# Patient Record
Sex: Female | Born: 1990 | Race: White | Hispanic: No | Smoking: Current every day smoker
Health system: Southern US, Community
[De-identification: ages and names within clinical notes are randomized; demographics above are authoritative.]

## PROBLEM LIST (undated history)

## (undated) DIAGNOSIS — K8020 Calculus of gallbladder without cholecystitis without obstruction: Secondary | ICD-10-CM

## (undated) DIAGNOSIS — E559 Vitamin D deficiency, unspecified: Secondary | ICD-10-CM

## (undated) HISTORY — PX: TUMOR REMOVAL: SHX12

## (undated) HISTORY — DX: Vitamin D deficiency, unspecified: E55.9

---

## 2014-10-23 ENCOUNTER — Emergency Department (HOSPITAL_COMMUNITY): Payer: BLUE CROSS/BLUE SHIELD

## 2014-10-23 ENCOUNTER — Encounter (HOSPITAL_COMMUNITY): Payer: Self-pay | Admitting: *Deleted

## 2014-10-23 ENCOUNTER — Emergency Department (HOSPITAL_COMMUNITY)
Admission: EM | Admit: 2014-10-23 | Discharge: 2014-10-23 | Disposition: A | Discharge status: Home / Self Care | Payer: BLUE CROSS/BLUE SHIELD | Attending: Emergency Medicine | Admitting: Emergency Medicine

## 2014-10-23 ENCOUNTER — Emergency Department (HOSPITAL_COMMUNITY)
Admission: EM | Admit: 2014-10-23 | Discharge: 2014-10-23 | Disposition: A | Discharge status: Other Acute Inpatient Hospital | Payer: BLUE CROSS/BLUE SHIELD | Source: Home / Self Care | Attending: Family Medicine | Admitting: Family Medicine

## 2014-10-23 DIAGNOSIS — K8020 Calculus of gallbladder without cholecystitis without obstruction: Secondary | ICD-10-CM

## 2014-10-23 DIAGNOSIS — R1013 Epigastric pain: Secondary | ICD-10-CM | POA: Diagnosis present

## 2014-10-23 DIAGNOSIS — R630 Anorexia: Secondary | ICD-10-CM | POA: Insufficient documentation

## 2014-10-23 DIAGNOSIS — R109 Unspecified abdominal pain: Secondary | ICD-10-CM

## 2014-10-23 LAB — COMPREHENSIVE METABOLIC PANEL
ALT: 17 U/L (ref 0–35)
AST: 22 U/L (ref 0–37)
Albumin: 4 g/dL (ref 3.5–5.2)
Alkaline Phosphatase: 46 U/L (ref 39–117)
Anion gap: 7 (ref 5–15)
BUN: 8 mg/dL (ref 6–23)
CO2: 26 mmol/L (ref 19–32)
CREATININE: 0.92 mg/dL (ref 0.50–1.10)
Calcium: 9.3 mg/dL (ref 8.4–10.5)
Chloride: 109 mmol/L (ref 96–112)
GFR calc Af Amer: >90 mL/min (ref >90)
GFR calc non Af Amer: 88 mL/min — ABNORMAL LOW (ref >90)
Glucose, Bld: 108 mg/dL — ABNORMAL HIGH (ref 70–99)
Potassium: 4.4 mmol/L (ref 3.5–5.1)
Sodium: 142 mmol/L (ref 135–145)
TOTAL PROTEIN: 6.8 g/dL (ref 6.0–8.3)
Total Bilirubin: 0.6 mg/dL (ref 0.3–1.2)

## 2014-10-23 LAB — CBC WITH DIFFERENTIAL/PLATELET
BASOS ABS: 0 10*3/uL (ref 0.0–0.1)
BASOS PCT: 0 % (ref 0–1)
EOS ABS: 0 10*3/uL (ref 0.0–0.7)
EOS PCT: 0 % (ref 0–5)
HEMATOCRIT: 38.3 % (ref 36.0–46.0)
HEMOGLOBIN: 13.2 g/dL (ref 12.0–15.0)
LYMPHS ABS: 1 10*3/uL (ref 0.7–4.0)
LYMPHS PCT: 8 % — AB (ref 12–46)
MCH: 30.1 pg (ref 26.0–34.0)
MCHC: 34.5 g/dL (ref 30.0–36.0)
MCV: 87.4 fL (ref 78.0–100.0)
MONO ABS: 0.5 10*3/uL (ref 0.1–1.0)
Monocytes Relative: 4 % (ref 3–12)
Neutro Abs: 11.3 10*3/uL — ABNORMAL HIGH (ref 1.7–7.7)
Neutrophils Relative %: 88 % — ABNORMAL HIGH (ref 43–77)
Platelets: 360 10*3/uL (ref 150–400)
RBC: 4.38 MIL/uL (ref 3.87–5.11)
RDW: 12.8 % (ref 11.5–15.5)
WBC: 12.8 10*3/uL — AB (ref 4.0–10.5)

## 2014-10-23 LAB — LIPASE, BLOOD: Lipase: 22 U/L (ref 11–59)

## 2014-10-23 NOTE — ED Notes (Signed)
Pt  Advised  To  Remain NPO

## 2014-10-23 NOTE — ED Notes (Signed)
Patient transported to Ultrasound 

## 2014-10-23 NOTE — ED Notes (Signed)
Pt sent here from ucc for further eval of mid abd pain that radiates into her back. Having n/v, denies diarrhea.

## 2014-10-23 NOTE — ED Notes (Signed)
Pt  Reports     Some      abd    And    Back  Pain        X   3  Months         Pt  Reports  The  Pain is  In  The  Epigastric  Area      -  She  Reports  Pain  Radiates  To  Her  Back        The  Pt is  Sitting upright on  The  Exam room     Speaking in  Complete  sentances       Skin is  Warm  And  Dry     -  Pt   Has  Not tried  Any  OTC    meds       -

## 2014-10-23 NOTE — Discharge Instructions (Signed)
Biliary Colic  °Biliary colic is a steady or irregular pain in the upper abdomen. It is usually under the right side of the rib cage. It happens when gallstones interfere with the normal flow of bile from the gallbladder. Bile is a liquid that helps to digest fats. Bile is made in the liver and stored in the gallbladder. When you eat a meal, bile passes from the gallbladder through the cystic duct and the common bile duct into the small intestine. There, it mixes with partially digested food. If a gallstone blocks either of these ducts, the normal flow of bile is blocked. The muscle cells in the bile duct contract forcefully to try to move the stone. This causes the pain of biliary colic.  °SYMPTOMS  °· A person with biliary colic usually complains of pain in the upper abdomen. This pain can be: °¨ In the center of the upper abdomen just below the breastbone. °¨ In the upper-right part of the abdomen, near the gallbladder and liver. °¨ Spread back toward the right shoulder blade. °· Nausea and vomiting. °· The pain usually occurs after eating. °· Biliary colic is usually triggered by the digestive system's demand for bile. The demand for bile is high after fatty meals. Symptoms can also occur when a person who has been fasting suddenly eats a very large meal. Most episodes of biliary colic pass after 1 to 5 hours. After the most intense pain passes, your abdomen may continue to ache mildly for about 24 hours. °DIAGNOSIS  °After you describe your symptoms, your caregiver will perform a physical exam. He or she will pay attention to the upper right portion of your belly (abdomen). This is the area of your liver and gallbladder. An ultrasound will help your caregiver look for gallstones. Specialized scans of the gallbladder may also be done. Blood tests may be done, especially if you have fever or if your pain persists. °PREVENTION  °Biliary colic can be prevented by controlling the risk factors for gallstones. Some of  these risk factors, such as heredity, increasing age, and pregnancy are a normal part of life. Obesity and a high-fat diet are risk factors you can change through a healthy lifestyle. Women going through menopause who take hormone replacement therapy (estrogen) are also more likely to develop biliary colic. °TREATMENT  °· Pain medication may be prescribed. °· You may be encouraged to eat a fat-free diet. °· If the first episode of biliary colic is severe, or episodes of colic keep retuning, surgery to remove the gallbladder (cholecystectomy) is usually recommended. This procedure can be done through small incisions using an instrument called a laparoscope. The procedure often requires a brief stay in the hospital. Some people can leave the hospital the same day. It is the most widely used treatment in people troubled by painful gallstones. It is effective and safe, with no complications in more than 90% of cases. °· If surgery cannot be done, medication that dissolves gallstones may be used. This medication is expensive and can take months or years to work. Only small stones will dissolve. °· Rarely, medication to dissolve gallstones is combined with a procedure called shock-wave lithotripsy. This procedure uses carefully aimed shock waves to break up gallstones. In many people treated with this procedure, gallstones form again within a few years. °PROGNOSIS  °If gallstones block your cystic duct or common bile duct, you are at risk for repeated episodes of biliary colic. There is also a 25% chance that you will develop   a gallbladder infection(acute cholecystitis), or some other complication of gallstones within 10 to 20 years. If you have surgery, schedule it at a time that is convenient for you and at a time when you are not sick. °HOME CARE INSTRUCTIONS  °· Drink plenty of clear fluids. °· Avoid fatty, greasy or fried foods, or any foods that make your pain worse. °· Take medications as directed. °SEEK MEDICAL  CARE IF:  °· You develop a fever over 100.5° F (38.1° C). °· Your pain gets worse over time. °· You develop nausea that prevents you from eating and drinking. °· You develop vomiting. °SEEK IMMEDIATE MEDICAL CARE IF:  °· You have continuous or severe belly (abdominal) pain which is not relieved with medications. °· You develop nausea and vomiting which is not relieved with medications. °· You have symptoms of biliary colic and you suddenly develop a fever and shaking chills. This may signal cholecystitis. Call your caregiver immediately. °· You develop a yellow color to your skin or the white part of your eyes (jaundice). °Document Released: 02/15/2006 Document Revised: 12/07/2011 Document Reviewed: 04/26/2008 °[employer]® Patient Information ©2015 [employer]. This information is not intended to replace advice given to you by your health care provider. Make sure you discuss any questions you have with your health care provider. ° °

## 2014-10-23 NOTE — ED Provider Notes (Signed)
CSN: [id number]     Arrival date & time 10/23/14  0802 History   None    Chief Complaint  Patient presents with  . Abdominal Pain   (Consider location/radiation/quality/duration/timing/severity/associated sxs/prior Treatment) Patient is a 24 y.o. female presenting with abdominal pain. The history is provided by the patient.  Abdominal Pain Pain location:  Epigastric and RUQ Pain quality: cramping   Pain radiates to:  Back Pain severity:  Moderate Onset quality:  Sudden Duration:  4 hours Chronicity:  Recurrent Context: awakening from sleep   Relieved by:  None tried Worsened by:  Nothing tried Ineffective treatments:  None tried Associated symptoms: vomiting   Associated symptoms: no diarrhea, no fever, no hematemesis and no nausea     History reviewed. No pertinent past medical history. History reviewed. No pertinent past surgical history. History reviewed. No pertinent family history. History  Substance Use Topics  . Smoking status: Not on file  . Smokeless tobacco: Not on file  . Alcohol Use: No   OB History    No data available     Review of Systems  Constitutional: Negative.  Negative for fever.  Gastrointestinal: Positive for vomiting and abdominal pain. Negative for nausea, diarrhea, blood in stool, abdominal distention and hematemesis.  Genitourinary: Negative.     Allergies  Review of patient's allergies indicates no known allergies.  Home Medications   Prior to Admission medications   Not on File   BP 116/69 mmHg  Pulse 60  Temp(Src) 98 F (36.7 C) (Oral)  Resp 16  SpO2 98%  LMP 09/26/2014 (Exact Date) Physical Exam  Constitutional: She is oriented to person, place, and time. She appears well-developed and well-nourished. No distress.  Abdominal: Soft. Normal appearance and bowel sounds are normal. She exhibits no distension and no mass. There is no hepatosplenomegaly. There is tenderness in the right upper quadrant and epigastric area. There is  no rebound, no guarding and no CVA tenderness.  Neurological: She is alert and oriented to person, place, and time.  Skin: Skin is warm and dry.  Nursing note and vitals reviewed.   ED Course  Procedures (including critical care time) Labs Review Labs Reviewed - No data to display  Imaging Review No results found.   MDM   1. Abdominal pain in female    Sent for eval --likely u/s for recurrent vomiting x 16mo episodes, poss gb.    Billy Fischer, MD 10/23/14 [phone]

## 2014-10-23 NOTE — ED Provider Notes (Addendum)
CSN: [id number]     Arrival date & time 10/23/14  3710 History   First MD Initiated Contact with Patient 10/23/14 [phone]     Chief Complaint  Patient presents with  . Abdominal Pain     (Consider location/radiation/quality/duration/timing/severity/associated sxs/prior Treatment) Patient is a 24 y.o. female presenting with abdominal pain. The history is provided by the patient.  Abdominal Pain Pain location:  Epigastric and RUQ Pain quality: sharp and shooting   Pain radiation: between the shoulders. Pain severity:  Moderate Onset quality:  Sudden Duration:  4 hours Timing:  Intermittent Progression:  Partially resolved Chronicity:  Recurrent (happening over the last 3 months) Context: eating   Context: not alcohol use   Context comment:  Pt had [employer] last night and woke up this morning with pain. Relieved by:  Nothing Worsened by:  Nothing tried Ineffective treatments:  None tried Associated symptoms: anorexia, nausea and vomiting   Associated symptoms: no chest pain, no cough, no diarrhea, no dysuria, no fever, no shortness of breath and no vaginal discharge   Risk factors: has not had multiple surgeries, not pregnant and no recent hospitalization     History reviewed. No pertinent past medical history. History reviewed. No pertinent past surgical history. History reviewed. No pertinent family history. History  Substance Use Topics  . Smoking status: Not on file  . Smokeless tobacco: Not on file  . Alcohol Use: No   OB History    No data available     Review of Systems  Constitutional: Negative for fever.  Respiratory: Negative for cough and shortness of breath.   Cardiovascular: Negative for chest pain.  Gastrointestinal: Positive for nausea, vomiting, abdominal pain and anorexia. Negative for diarrhea.  Genitourinary: Negative for dysuria and vaginal discharge.  All other systems reviewed and are negative.     Allergies  Review of patient's allergies indicates  no known allergies.  Home Medications   Prior to Admission medications   Not on File   BP 120/79 mmHg  Pulse 64  Temp(Src) 97.9 F (36.6 C) (Oral)  Resp 18  SpO2 98%  LMP 09/26/2014 (Exact Date) Physical Exam  Constitutional: She is oriented to person, place, and time. She appears well-developed and well-nourished. No distress.  HENT:  Head: Normocephalic and atraumatic.  Mouth/Throat: Oropharynx is clear and moist.  Eyes: Conjunctivae and EOM are normal. Pupils are equal, round, and reactive to light.  Neck: Normal range of motion. Neck supple.  Cardiovascular: Normal rate, regular rhythm and intact distal pulses.   No murmur heard. Pulmonary/Chest: Effort normal and breath sounds normal. No respiratory distress. She has no wheezes. She has no rales.  Abdominal: Soft. Normal appearance. She exhibits no distension. There is tenderness in the right upper quadrant and epigastric area. There is no rebound, no guarding, no CVA tenderness and negative Murphy's sign.  Musculoskeletal: Normal range of motion. She exhibits no edema or tenderness.  Neurological: She is alert and oriented to person, place, and time.  Skin: Skin is warm and dry. No rash noted. No erythema.  Psychiatric: She has a normal mood and affect. Her behavior is normal.  Nursing note and vitals reviewed.   ED Course  Procedures (including critical care time) Labs Review Labs Reviewed  CBC WITH DIFFERENTIAL/PLATELET - Abnormal; Notable for the following:    WBC 12.8 (*)    Neutrophils Relative % 88 (*)    Neutro Abs 11.3 (*)    Lymphocytes Relative 8 (*)    All other components  within normal limits  COMPREHENSIVE METABOLIC PANEL - Abnormal; Notable for the following:    Glucose, Bld 108 (*)    GFR calc non Af Amer 88 (*)    All other components within normal limits  LIPASE, BLOOD    Imaging Review US Abdomen Limited  10/23/2014   CLINICAL DATA:  24 year old female with right upper quadrant pain and  nausea for 4 months. Initial encounter.  EXAM: US ABDOMEN LIMITED - RIGHT UPPER QUADRANT  COMPARISON:  None.  FINDINGS: Gallbladder:  Several gallstones measuring up to 1.1 cm. 1.1 cm gallstone remains lodged in the gallbladder neck and does not change position with change in patient position. Gallbladder wall thickening measuring up to 6.8 mm without pericholecystic fluid. Per ultrasound technologist, patient was not tender over this region during scanning.  Common bile duct:  Diameter: 4.3 mm. Distal aspect not visualized secondary to bowel gas.  Liver:  No focal lesion identified. Within normal limits in parenchymal echogenicity.  IMPRESSION: Several gallstones with 1.1 cm gallstone lodged in the gallbladder neck region. Gallbladder wall thickening. Ultrasound technologist states the patient was not tender over this region during scanning. Findings therefore may represent chronic cholecystitis although acute cholecystitis not excluded in proper clinical setting.   Electronically Signed   By: Chauncey Cruel M.D.   On: 10/23/2014 11:07     EKG Interpretation None      MDM   Final diagnoses:  Calculus of gallbladder without cholecystitis without obstruction   patient sent from urgent care with symptoms most concerning for cholelithiasis. She has had intermittent pain in the epigastrium and right upper quadrant radiating between her shoulder blades for the last 3 months. It started in the middle of the night after she ate [employer].  She's had vomiting this morning but denies fever. She states the pain is starting to improve. She has no other medical problems. sHe denies any alcohol use.  CBC, CMP, lipase, limited right upper quadrant ultrasound pending. She currently is refusing pain medication.  12:34 PM Patient with mild leukocytosis of 12,000 but otherwise labs are normal. Ultrasound shows gallbladder wall thickening as well as gallstones. However on repeat exam patient's pain is resolved and she's  feeling much better. Counseled her about follow-up with [facility] for cholecystectomy and strict return precautions  Blanchie Dessert, MD 10/23/14 [place], MD 10/23/14 1236

## 2014-10-30 ENCOUNTER — Other Ambulatory Visit (INDEPENDENT_AMBULATORY_CARE_PROVIDER_SITE_OTHER): Payer: Self-pay | Admitting: Surgery

## 2014-10-30 NOTE — H&P (Signed)
Jodi Shah 10/30/2014 8:47 AM Location: [facility] Patient #: [id number] DOB: 1990-12-07 Separated / Language: Jodi Shah / Race: White Female History of Present Illness Jodi Hector MD; 10/30/2014 9:17 AM) Patient words: gallbladder.  The patient is a 24 year old female who presents for evaluation of gall stones. Patient sent for surgical consultation by Jodi Shah emergency room physician Dr. Maryan Shah for concern of chronic cholecystitis.  Pleasant obese female. She has struggled with intermittent bouts of abdominal pain for the past 5 months. Usually wakes her up at night. Epigastric pain that radiates to her back. She will get nausea and vomiting. Not related to activity. Denies any heartburn or reflux. She does confess that she "eats a lot of junk food". Normally has a bowel movement every day. No history of irritable bowel syndrome or inflammatory bowel disease. No history of hepatitis or pancreatitis. No history of jaundice. Can walk a half hour without difficulty. No history of cardiopulmonary or neurologic issues. She's never had any abdominal surgery.          CLINICAL DATA: 24 year old female with right upper quadrant pain and nausea for 4 months. Initial encounter.  EXAM: US ABDOMEN LIMITED - RIGHT UPPER QUADRANT  COMPARISON: None.  FINDINGS: Gallbladder:  Several gallstones measuring up to 1.1 cm. 1.1 cm gallstone remains lodged in the gallbladder neck and does not change position with change in patient position. Gallbladder wall thickening measuring up to 6.8 mm without pericholecystic fluid. Per ultrasound technologist, patient was not tender over this region during scanning.  Common bile duct:  Diameter: 4.3 mm. Distal aspect not visualized secondary to bowel gas.  Liver:  No focal lesion identified. Within normal limits in parenchymal echogenicity.  IMPRESSION: Several gallstones with 1.1 cm gallstone lodged in the  gallbladder neck region. Gallbladder wall thickening. Ultrasound technologist states the patient was not tender over this region during scanning. Findings therefore may represent chronic cholecystitis although acute cholecystitis not excluded in proper clinical setting.   Electronically Signed By: Jodi Shah M.D. On: 10/23/2014 11:07 Other Problems ([place], [place]; 10/30/2014 8:47 AM) Cholelithiasis Depression  Past Surgical History Jodi Shah, Jodi Shah; 10/30/2014 8:47 AM) Oral Surgery  Diagnostic Studies History Jodi Shah, Jodi Shah; 10/30/2014 8:47 AM) Colonoscopy never Mammogram never Pap Smear never  Allergies Jodi Shah, Jodi Shah; 10/30/2014 8:48 AM) No Known Drug Allergies 10/30/2014  Medication History (Jodi Shah, Jodi Shah; 10/30/2014 8:48 AM) No Current Medications  Social History Jodi Shah, Jodi Shah; 10/30/2014 8:47 AM) Alcohol use Moderate alcohol use. Caffeine use Carbonated beverages, Coffee. Illicit drug use Uses monthly. Tobacco use Current some day smoker.  Family History Jodi Shah, Jodi Shah; 10/30/2014 8:47 AM) Alcohol Abuse Brother.  Pregnancy / Birth History Jodi Shah, [place]; 10/30/2014 8:47 AM) Age at menarche 17 years. Gravida 0 Irregular periods Para 0     Review of Systems (Jodi Shah Jodi Shah; 10/30/2014 8:47 AM) General Not Present- Appetite Loss, Chills, Fatigue, Fever, Night Sweats, Weight Gain and Weight Loss. Skin Not Present- Change in Wart/Mole, Dryness, Hives, Jaundice, New Lesions, Non-Healing Wounds, Rash and Ulcer. HEENT Not Present- Earache, Hearing Loss, Hoarseness, Nose Bleed, Oral Ulcers, Ringing in the Ears, Seasonal Allergies, Sinus Pain, Sore Throat, Visual Disturbances, Wears glasses/contact lenses and Yellow Eyes. Respiratory Not Present- Bloody sputum, Chronic Cough, Difficulty Breathing, Snoring and Wheezing. Breast Not Present- Breast Mass, Breast Pain, Nipple Discharge and Skin Changes. Cardiovascular Not Present- Chest Pain,  Difficulty Breathing Lying Down, Leg Cramps, Palpitations, Rapid Heart Rate, Shortness of Breath and Swelling of Extremities. Gastrointestinal Not  Present- Abdominal Pain, Bloating, Bloody Stool, Change in Bowel Habits, Chronic diarrhea, Constipation, Difficulty Swallowing, Excessive gas, Gets full quickly at meals, Hemorrhoids, Indigestion, Nausea, Rectal Pain and Vomiting. Female Genitourinary Not Present- Frequency, Nocturia, Painful Urination, Pelvic Pain and Urgency. Musculoskeletal Not Present- Back Pain, Joint Pain, Joint Stiffness, Muscle Pain, Muscle Weakness and Swelling of Extremities. Neurological Not Present- Decreased Memory, Fainting, Headaches, Numbness, Seizures, Tingling, Tremor, Trouble walking and Weakness. Psychiatric Present- Bipolar. Not Present- Anxiety, Change in Sleep Pattern, Depression, Fearful and Frequent crying. Endocrine Not Present- Cold Intolerance, Excessive Hunger, Hair Changes, Heat Intolerance, Hot flashes and New Diabetes. Hematology Not Present- Easy Bruising, Excessive bleeding, Gland problems, HIV and Persistent Infections.  Vitals (Jodi Shah Jodi Shah; 10/30/2014 8:48 AM) 10/30/2014 8:48 AM Weight: 193 lb Height: 64in Body Surface Area: 1.99 m Body Mass Index: 33.13 kg/m Temp.: 98.61F(Temporal)  Pulse: 77 (Regular)  BP: 124/84 (Sitting, Left Arm, Standard)     Physical Exam Jodi Hector MD; 10/30/2014 9:15 AM)  General Mental Status-Alert. General Appearance-Not in acute distress, Not Sickly. Orientation-Oriented X3. Hydration-Well hydrated. Voice-Normal.  Integumentary Global Assessment Upon inspection and palpation of skin surfaces of the - Axillae: non-tender, no inflammation or ulceration, no drainage. and Distribution of scalp and body hair is normal. General Characteristics Temperature - normal warmth is noted.  Head and Neck Head-normocephalic, atraumatic with no lesions or palpable masses. Face Global  Assessment - atraumatic, no absence of expression. Neck Global Assessment - no abnormal movements, no bruit auscultated on the right, no bruit auscultated on the left, no decreased range of motion, non-tender. Trachea-midline. Thyroid Gland Characteristics - non-tender.  Eye Eyeball - Left-Extraocular movements intact, No Nystagmus. Eyeball - Right-Extraocular movements intact, No Nystagmus. Cornea - Left-No Hazy. Cornea - Right-No Hazy. Sclera/Conjunctiva - Left-No scleral icterus, No Discharge. Sclera/Conjunctiva - Right-No scleral icterus, No Discharge. Pupil - Left-Direct reaction to light normal. Pupil - Right-Direct reaction to light normal.  ENMT Ears Pinna - Left - no drainage observed, no generalized tenderness observed. Right - no drainage observed, no generalized tenderness observed. Nose and Sinuses External Inspection of the Nose - no destructive lesion observed. Inspection of the nares - Left - quiet respiration. Right - quiet respiration. Mouth and Throat Lips - Upper Lip - no fissures observed, no pallor noted. Lower Lip - no fissures observed, no pallor noted. Nasopharynx - no discharge present. Oral Cavity/Oropharynx - Tongue - no dryness observed. Oral Mucosa - no cyanosis observed. Hypopharynx - no evidence of airway distress observed.  Chest and Lung Exam Inspection Movements - Normal and Symmetrical. Accessory muscles - No use of accessory muscles in breathing. Palpation Palpation of the chest reveals - Non-tender. Auscultation Breath sounds - Normal and Clear.  Cardiovascular Auscultation Rhythm - Regular. Murmurs & Other Heart Sounds - Auscultation of the heart reveals - No Murmurs and No Systolic Clicks.  Abdomen Inspection Inspection of the abdomen reveals - No Visible peristalsis and No Abnormal pulsations. Umbilicus - No Bleeding, No Urine drainage. Palpation/Percussion Palpation and Percussion of the abdomen reveal - Soft, Non  Tender, No Rebound tenderness, No Rigidity (guarding) and No Cutaneous hyperesthesia.  Female Genitourinary Sexual Maturity Tanner 5 - Adult hair pattern. Note: No vaginal bleeding nor discharge   Peripheral Vascular Upper Extremity Inspection - Left - No Cyanotic nailbeds, Not Ischemic. Right - No Cyanotic nailbeds, Not Ischemic.  Neurologic Neurologic evaluation reveals -normal attention span and ability to concentrate, able to name objects and repeat phrases. Appropriate fund of knowledge , normal sensation and  normal coordination. Mental Status Affect - not angry, not paranoid. Cranial Nerves-Normal Bilaterally. Gait-Normal.  Neuropsychiatric Mental status exam performed with findings of-able to articulate well with normal speech/language, rate, volume and coherence, thought content normal with ability to perform basic computations and apply abstract reasoning and no evidence of hallucinations, delusions, obsessions or homicidal/suicidal ideation.  Musculoskeletal Global Assessment Spine, Ribs and Pelvis - no instability, subluxation or laxity. Right Upper Extremity - no instability, subluxation or laxity.  Lymphatic Head & Neck  General Head & Neck Lymphatics: Bilateral - Description - No Localized lymphadenopathy. Axillary  General Axillary Region: Bilateral - Description - No Localized lymphadenopathy. Femoral & Inguinal  Generalized Femoral & Inguinal Lymphatics: Left - Description - No Localized lymphadenopathy. Right - Description - No Localized lymphadenopathy.    Assessment & Plan Jodi Hector MD; 10/30/2014 9:17 AM)  CHRONIC CHOLECYSTITIS WITH CALCULUS (574.10  K80.10)  Impression: Story very classic for recurrent bouts of biliary colic. Some GB wall thickening noted on ultrasound but no Murphy sign. Suspicious for chronic cholecystitis. I think she would benefit from cholecystectomy. Rest of differential diagnosis seems unlikely. She agrees. We'll  plan outpatient cholecystectomy soon. Reasonable start out with single site approach.  The anatomy & physiology of hepatobiliary & pancreatic function was discussed. The pathophysiology of gallbladder dysfunction was discussed. Natural history risks without surgery was discussed. I feel the risks of no intervention will lead to serious problems that outweigh the operative risks; therefore, I recommended cholecystectomy to remove the pathology. I explained laparoscopic techniques with possible need for an open approach. Probable cholangiogram to evaluate the bilary tract was explained as well.  Risks such as bleeding, infection, abscess, leak, injury to other organs, need for further treatment, heart attack, death, and other risks were discussed. I noted a good likelihood this will help address the problem. Possibility that this will not correct all abdominal symptoms was explained. Goals of post-operative recovery were discussed as well. We will work to minimize complications. An educational handout further explaining the pathology and treatment options was given as well. Questions were answered. The patient expresses understanding & wishes to proceed with surgery.  Current Plans Schedule for Surgery Pt Education - [facility] Laparosopic Post Op HCI (Marilynn Ekstein) Pt Education - [facility] Good Bowel Health (Ziomara Birenbaum) Pt Education - [facility] Pain Control (Ladarrious Kirksey) The anatomy & physiology of hepatobiliary & pancreatic function was discussed. The pathophysiology of gallbladder dysfunction was discussed. Natural history risks without surgery was discussed. I feel the risks of no intervention will lead to serious problems that outweigh the operative risks; therefore, I recommended cholecystectomy to remove the pathology. I explained laparoscopic techniques with possible need for an open approach. Probable cholangiogram to evaluate the bilary tract was explained as well.  Risks such as bleeding, infection, abscess, leak, injury to other  organs, need for further treatment, heart attack, death, and other risks were discussed. I noted a good likelihood this will help address the problem. Possibility that this will not correct all abdominal symptoms was explained. Goals of post-operative recovery were discussed as well. We will work to minimize complications. An educational handout further explaining the pathology and treatment options was given as well. Questions were answered. The patient expresses understanding & wishes to proceed with surgery.  Jodi Shah, M.D., F.A.C.S. Gastrointestinal and Minimally Invasive Surgery [facility] [address] [place], [place] [address] [phone] Main / Paging

## 2014-12-09 ENCOUNTER — Encounter (HOSPITAL_COMMUNITY): Payer: Self-pay | Admitting: Emergency Medicine

## 2014-12-09 ENCOUNTER — Observation Stay (HOSPITAL_COMMUNITY)
Admission: EM | Admit: 2014-12-09 | Discharge: 2014-12-11 | Disposition: A | Discharge status: Home / Self Care | Payer: BLUE CROSS/BLUE SHIELD | Attending: General Surgery | Admitting: General Surgery

## 2014-12-09 DIAGNOSIS — K8000 Calculus of gallbladder with acute cholecystitis without obstruction: Secondary | ICD-10-CM | POA: Diagnosis present

## 2014-12-09 DIAGNOSIS — K8020 Calculus of gallbladder without cholecystitis without obstruction: Secondary | ICD-10-CM

## 2014-12-09 DIAGNOSIS — Z6831 Body mass index (BMI) 31.0-31.9, adult: Secondary | ICD-10-CM | POA: Insufficient documentation

## 2014-12-09 DIAGNOSIS — F17210 Nicotine dependence, cigarettes, uncomplicated: Secondary | ICD-10-CM | POA: Diagnosis not present

## 2014-12-09 DIAGNOSIS — R109 Unspecified abdominal pain: Secondary | ICD-10-CM | POA: Diagnosis present

## 2014-12-09 HISTORY — DX: Calculus of gallbladder without cholecystitis without obstruction: K80.20

## 2014-12-09 LAB — CBC WITH DIFFERENTIAL/PLATELET
BASOS PCT: 0 % (ref 0–1)
Basophils Absolute: 0 10*3/uL (ref 0.0–0.1)
EOS ABS: 0 10*3/uL (ref 0.0–0.7)
Eosinophils Relative: 0 % (ref 0–5)
HEMATOCRIT: 40.1 % (ref 36.0–46.0)
Hemoglobin: 13.9 g/dL (ref 12.0–15.0)
LYMPHS ABS: 0.7 10*3/uL (ref 0.7–4.0)
Lymphocytes Relative: 5 % — ABNORMAL LOW (ref 12–46)
MCH: 30.5 pg (ref 26.0–34.0)
MCHC: 34.7 g/dL (ref 30.0–36.0)
MCV: 87.9 fL (ref 78.0–100.0)
MONO ABS: 0.3 10*3/uL (ref 0.1–1.0)
Monocytes Relative: 2 % — ABNORMAL LOW (ref 3–12)
NEUTROS ABS: 13.8 10*3/uL — AB (ref 1.7–7.7)
Neutrophils Relative %: 93 % — ABNORMAL HIGH (ref 43–77)
PLATELETS: 340 10*3/uL (ref 150–400)
RBC: 4.56 MIL/uL (ref 3.87–5.11)
RDW: 12.7 % (ref 11.5–15.5)
WBC: 14.8 10*3/uL — ABNORMAL HIGH (ref 4.0–10.5)

## 2014-12-09 LAB — COMPREHENSIVE METABOLIC PANEL
ALT: 13 U/L (ref 0–35)
AST: 22 U/L (ref 0–37)
Albumin: 4.1 g/dL (ref 3.5–5.2)
Alkaline Phosphatase: 46 U/L (ref 39–117)
Anion gap: 10 (ref 5–15)
BUN: 6 mg/dL (ref 6–23)
CHLORIDE: 104 mmol/L (ref 96–112)
CO2: 26 mmol/L (ref 19–32)
Calcium: 9.8 mg/dL (ref 8.4–10.5)
Creatinine, Ser: 0.91 mg/dL (ref 0.50–1.10)
GFR calc non Af Amer: 89 mL/min — ABNORMAL LOW (ref >90)
Glucose, Bld: 121 mg/dL — ABNORMAL HIGH (ref 70–99)
POTASSIUM: 4.6 mmol/L (ref 3.5–5.1)
SODIUM: 140 mmol/L (ref 135–145)
Total Bilirubin: 0.7 mg/dL (ref 0.3–1.2)
Total Protein: 7.1 g/dL (ref 6.0–8.3)

## 2014-12-09 LAB — LIPASE, BLOOD: Lipase: 16 U/L (ref 11–59)

## 2014-12-09 MED ORDER — SODIUM CHLORIDE 0.9 % IV BOLUS (SEPSIS): 1000.0000 mL | Freq: Once | INTRAVENOUS | Status: DC

## 2014-12-09 MED ORDER — ONDANSETRON 4 MG PO TBDP
4.0000 mg | ORAL_TABLET | Freq: Once | ORAL | Status: AC
  Administered 2014-12-09: 4 mg via ORAL
  Filled 2014-12-09: qty 1

## 2014-12-09 MED ORDER — SODIUM CHLORIDE 0.9 % IV SOLN: INTRAVENOUS | Status: DC

## 2014-12-09 MED ORDER — FENTANYL CITRATE 0.05 MG/ML IJ SOLN
50.0000 ug | Freq: Once | INTRAMUSCULAR | Status: AC
  Administered 2014-12-09: 50 ug via NASAL
  Filled 2014-12-09: qty 2

## 2014-12-09 NOTE — ED Notes (Addendum)
Pt reports that she was dx with gallstones, advised to come back in if she started vomiting and could not get it under control. Pt reports vomiting started this morning after eating donuts and has continued throughout the day. Pt not actively vomiting at this time, alert, oriented, nad.

## 2014-12-09 NOTE — ED Notes (Signed)
Patient states she drove but can call to have someone pick her up.

## 2014-12-09 NOTE — ED Provider Notes (Signed)
CSN: [id number]     Arrival date & time 12/09/14  1926 History   This chart was scribed for Jodi Leigh, MD by Eustaquio Maize, ED Scribe. This patient was seen in room D31C/D31C and the patient's care was started at 11:12 PM.    Chief Complaint  Patient presents with  . Abdominal Pain    diagnosis of gallstones recently  . Emesis   HPI Comments: Sx persistent and nothing makes them better, no tx used pta  The history is provided by the patient. No language interpreter was used.    HPI Comments: Jodi Shah is a 24 y.o. female who presents to the Emergency Department complaining of vomiting that began earlier this morning around 10 AM. Pt reports that the symptoms began after eating doughnuts. She also complains of abdominal pain in the RUQ and epigastric region. Pt was diagnosed with gall stones recently and is scheduled to have surgery at [facility] in April She denies fever or any other symptoms.    Past Medical History  Diagnosis Date  . Gallstones    History reviewed. No pertinent past surgical history. No family history on file. History  Substance Use Topics  . Smoking status: Current Every Day Smoker -- 0.50 packs/day    Types: Cigarettes  . Smokeless tobacco: Current User  . Alcohol Use: 2.4 oz/week    4 Glasses of wine per week     Comment: socially only    OB History    No data available     Review of Systems  Constitutional: Negative for fever.  Gastrointestinal: Positive for nausea, vomiting and abdominal pain (RUQ and epigastric pain. ).  All other systems reviewed and are negative.     Allergies  Pumpkin seed  Home Medications   Prior to Admission medications   Not on File   Triage Vitals: BP 95/55 mmHg  Pulse 79  Temp(Src) 98.3 F (36.8 C) (Oral)  Resp 22  Ht 5\' 4"  (1.626 m)  Wt 185 lb (83.915 kg)  BMI 31.74 kg/m2  SpO2 99%  LMP 12/02/2014 (Approximate)   Physical Exam  Constitutional: She is oriented to person, place, and time.  She appears well-developed and well-nourished.  Non-toxic appearance. No distress.     HENT:  Head: Normocephalic and atraumatic.  Eyes: Conjunctivae, EOM and lids are normal. Pupils are equal, round, and reactive to light.  Neck: Normal range of motion. Neck supple. No tracheal deviation present. No thyroid mass present.  Cardiovascular: Normal rate, regular rhythm and normal heart sounds.  Exam reveals no gallop.   No murmur heard. Pulmonary/Chest: Effort normal and breath sounds normal. No stridor. No respiratory distress. She has no decreased breath sounds. She has no wheezes. She has no rhonchi. She has no rales.  Abdominal: Soft. Normal appearance and bowel sounds are normal. She exhibits no distension. There is tenderness (Mild tenderness RUQ and epigastric area. ). There is no rebound, no guarding and no CVA tenderness.  Musculoskeletal: Normal range of motion. She exhibits no edema or tenderness.  Neurological: She is alert and oriented to person, place, and time. She has normal strength. No cranial nerve deficit or sensory deficit. GCS eye subscore is 4. GCS verbal subscore is 5. GCS motor subscore is 6.  Skin: Skin is warm and dry. No abrasion and no rash noted.  Psychiatric: She has a normal mood and affect. Her speech is normal and behavior is normal.  Nursing note and vitals reviewed.   ED Course  Procedures (  including critical care time)  DIAGNOSTIC STUDIES: Oxygen Saturation is 99% on Ra, normal by my interpretation.    COORDINATION OF CARE: 11:14 PM-Discussed treatment plan which includes CBC, CMP, Lipase with pt at bedside and pt agreed to plan.   Labs Review Labs Reviewed  CBC WITH DIFFERENTIAL/PLATELET - Abnormal; Notable for the following:    WBC 14.8 (*)    Neutrophils Relative % 93 (*)    Neutro Abs 13.8 (*)    Lymphocytes Relative 5 (*)    Monocytes Relative 2 (*)    All other components within normal limits  COMPREHENSIVE METABOLIC PANEL - Abnormal; Notable  for the following:    Glucose, Bld 121 (*)    GFR calc non Af Amer 89 (*)    All other components within normal limits  LIPASE, BLOOD    Imaging Review No results found.   EKG Interpretation None      MDM   Final diagnoses:  None    I personally performed the services described in this documentation, which was scribed in my presence. The recorded information has been reviewed and is accurate.    1:34 AM Patient given pain meds here and does feel slightly better. Reexam shows her to have persistent right upper quadrant tenderness. Will consult general surgery  Jodi Leigh, MD 12/10/14 [phone]

## 2014-12-09 NOTE — ED Notes (Signed)
MD at bedside. 

## 2014-12-09 NOTE — ED Notes (Signed)
Patient states she was in two weeks ago. She was diagnosed with gallstones. Her surgery is scheduled for April. She has started vomiting today at least 7 times and has been dry heaving.

## 2014-12-09 NOTE — ED Notes (Signed)
Dr. Zenia Resides advised patient does not have to have an IV at this time since she is not actively vomiting.

## 2014-12-10 ENCOUNTER — Observation Stay (HOSPITAL_COMMUNITY): Payer: BLUE CROSS/BLUE SHIELD | Admitting: Anesthesiology

## 2014-12-10 ENCOUNTER — Encounter (HOSPITAL_COMMUNITY)
Admission: EM | Disposition: A | Discharge status: Home / Self Care | Payer: Self-pay | Source: Home / Self Care | Attending: General Surgery

## 2014-12-10 DIAGNOSIS — K8000 Calculus of gallbladder with acute cholecystitis without obstruction: Secondary | ICD-10-CM | POA: Diagnosis present

## 2014-12-10 DIAGNOSIS — K8020 Calculus of gallbladder without cholecystitis without obstruction: Secondary | ICD-10-CM | POA: Diagnosis present

## 2014-12-10 HISTORY — PX: CHOLECYSTECTOMY: SHX55

## 2014-12-10 LAB — HCG, SERUM, QUALITATIVE: Preg, Serum: NEGATIVE

## 2014-12-10 SURGERY — LAPAROSCOPIC CHOLECYSTECTOMY WITH INTRAOPERATIVE CHOLANGIOGRAM
Anesthesia: General | Site: Abdomen

## 2014-12-10 MED ORDER — HYDROMORPHONE HCL 1 MG/ML IJ SOLN
0.2500 mg | INTRAMUSCULAR | Status: DC | PRN
  Administered 2014-12-10 (×2): 0.5 mg via INTRAVENOUS

## 2014-12-10 MED ORDER — NEOSTIGMINE METHYLSULFATE 10 MG/10ML IV SOLN
INTRAVENOUS | Status: AC
  Filled 2014-12-10: qty 1

## 2014-12-10 MED ORDER — MIDAZOLAM HCL 2 MG/2ML IJ SOLN
INTRAMUSCULAR | Status: AC
  Filled 2014-12-10: qty 2

## 2014-12-10 MED ORDER — BUPIVACAINE-EPINEPHRINE (PF) 0.25% -1:200000 IJ SOLN
INTRAMUSCULAR | Status: AC
  Filled 2014-12-10: qty 30

## 2014-12-10 MED ORDER — ONDANSETRON HCL 4 MG/2ML IJ SOLN
INTRAMUSCULAR | Status: AC
  Filled 2014-12-10: qty 2

## 2014-12-10 MED ORDER — BOOST / RESOURCE BREEZE PO LIQD
1.0000 | Freq: Two times a day (BID) | ORAL | Status: DC
  Administered 2014-12-10 – 2014-12-11 (×2): 1 via ORAL

## 2014-12-10 MED ORDER — OXYCODONE-ACETAMINOPHEN 5-325 MG PO TABS
1.0000 | ORAL_TABLET | ORAL | Status: DC | PRN
  Administered 2014-12-10 – 2014-12-11 (×4): 2 via ORAL
  Filled 2014-12-10 (×5): qty 2

## 2014-12-10 MED ORDER — FENTANYL CITRATE 0.05 MG/ML IJ SOLN
INTRAMUSCULAR | Status: DC | PRN
  Administered 2014-12-10: 100 ug via INTRAVENOUS
  Administered 2014-12-10 (×3): 50 ug via INTRAVENOUS
  Administered 2014-12-10: 100 ug via INTRAVENOUS
  Administered 2014-12-10: 50 ug via INTRAVENOUS

## 2014-12-10 MED ORDER — DIPHENHYDRAMINE HCL 12.5 MG/5ML PO ELIX
12.5000 mg | ORAL_SOLUTION | Freq: Four times a day (QID) | ORAL | Status: DC | PRN
  Filled 2014-12-10: qty 10

## 2014-12-10 MED ORDER — ROCURONIUM BROMIDE 50 MG/5ML IV SOLN
INTRAVENOUS | Status: AC
  Filled 2014-12-10: qty 1

## 2014-12-10 MED ORDER — KETOROLAC TROMETHAMINE 30 MG/ML IJ SOLN
INTRAMUSCULAR | Status: AC
  Filled 2014-12-10: qty 1

## 2014-12-10 MED ORDER — FENTANYL CITRATE 0.05 MG/ML IJ SOLN
INTRAMUSCULAR | Status: AC
  Filled 2014-12-10: qty 5

## 2014-12-10 MED ORDER — KCL IN DEXTROSE-NACL 20-5-0.45 MEQ/L-%-% IV SOLN
INTRAVENOUS | Status: DC
  Administered 2014-12-10: 09:00:00 via INTRAVENOUS
  Filled 2014-12-10 (×2): qty 1000

## 2014-12-10 MED ORDER — GLYCOPYRROLATE 0.2 MG/ML IJ SOLN
INTRAMUSCULAR | Status: AC
  Filled 2014-12-10: qty 3

## 2014-12-10 MED ORDER — BUPIVACAINE-EPINEPHRINE 0.25% -1:200000 IJ SOLN
INTRAMUSCULAR | Status: DC | PRN
  Administered 2014-12-10: 30 mL

## 2014-12-10 MED ORDER — HYDROMORPHONE HCL 1 MG/ML IJ SOLN
1.0000 mg | INTRAMUSCULAR | Status: AC | PRN
  Administered 2014-12-10: 1 mg via INTRAVENOUS
  Filled 2014-12-10: qty 1

## 2014-12-10 MED ORDER — HYDROMORPHONE HCL 1 MG/ML IJ SOLN
INTRAMUSCULAR | Status: AC
  Filled 2014-12-10: qty 1

## 2014-12-10 MED ORDER — LACTATED RINGERS IV SOLN
INTRAVENOUS | Status: DC
  Administered 2014-12-10: 15:00:00 via INTRAVENOUS

## 2014-12-10 MED ORDER — ROCURONIUM BROMIDE 100 MG/10ML IV SOLN
INTRAVENOUS | Status: DC | PRN
  Administered 2014-12-10: 30 mg via INTRAVENOUS

## 2014-12-10 MED ORDER — PROMETHAZINE HCL 25 MG/ML IJ SOLN: 6.2500 mg | INTRAMUSCULAR | Status: DC | PRN

## 2014-12-10 MED ORDER — BOOST / RESOURCE BREEZE PO LIQD: 1.0000 | Freq: Two times a day (BID) | ORAL | Status: DC

## 2014-12-10 MED ORDER — LIDOCAINE HCL (CARDIAC) 20 MG/ML IV SOLN
INTRAVENOUS | Status: AC
  Filled 2014-12-10: qty 5

## 2014-12-10 MED ORDER — DEXTROSE 5 % IV SOLN
2.0000 g | Freq: Four times a day (QID) | INTRAVENOUS | Status: DC
  Administered 2014-12-10 (×2): 2 g via INTRAVENOUS
  Filled 2014-12-10 (×5): qty 2

## 2014-12-10 MED ORDER — NEOSTIGMINE METHYLSULFATE 10 MG/10ML IV SOLN
INTRAVENOUS | Status: DC | PRN
Start: 2014-12-10 — End: 2014-12-10
  Administered 2014-12-10: 4 mg via INTRAVENOUS

## 2014-12-10 MED ORDER — DIPHENHYDRAMINE HCL 50 MG/ML IJ SOLN: 12.5000 mg | Freq: Four times a day (QID) | INTRAMUSCULAR | Status: DC | PRN

## 2014-12-10 MED ORDER — PROPOFOL 10 MG/ML IV BOLUS
INTRAVENOUS | Status: DC | PRN
  Administered 2014-12-10: 160 mg via INTRAVENOUS

## 2014-12-10 MED ORDER — ONDANSETRON HCL 4 MG/2ML IJ SOLN: 4.0000 mg | Freq: Three times a day (TID) | INTRAMUSCULAR | Status: DC | PRN

## 2014-12-10 MED ORDER — LACTATED RINGERS IV SOLN
INTRAVENOUS | Status: DC | PRN
  Administered 2014-12-10 (×2): via INTRAVENOUS

## 2014-12-10 MED ORDER — MEPERIDINE HCL 25 MG/ML IJ SOLN: 6.2500 mg | INTRAMUSCULAR | Status: DC | PRN

## 2014-12-10 MED ORDER — ACETAMINOPHEN 325 MG PO TABS: 650.0000 mg | ORAL_TABLET | Freq: Four times a day (QID) | ORAL | Status: DC | PRN

## 2014-12-10 MED ORDER — ACETAMINOPHEN 650 MG RE SUPP: 650.0000 mg | Freq: Four times a day (QID) | RECTAL | Status: DC | PRN

## 2014-12-10 MED ORDER — MORPHINE SULFATE 2 MG/ML IJ SOLN
1.0000 mg | INTRAMUSCULAR | Status: DC | PRN
  Administered 2014-12-10 (×2): 4 mg via INTRAVENOUS
  Administered 2014-12-10: 2 mg via INTRAVENOUS
  Administered 2014-12-11: 4 mg via INTRAVENOUS
  Filled 2014-12-10 (×2): qty 2
  Filled 2014-12-10: qty 1
  Filled 2014-12-10: qty 2

## 2014-12-10 MED ORDER — MIDAZOLAM HCL 5 MG/5ML IJ SOLN
INTRAMUSCULAR | Status: DC | PRN
  Administered 2014-12-10: 2 mg via INTRAVENOUS

## 2014-12-10 MED ORDER — PHENYLEPHRINE 40 MCG/ML (10ML) SYRINGE FOR IV PUSH (FOR BLOOD PRESSURE SUPPORT)
PREFILLED_SYRINGE | INTRAVENOUS | Status: AC
  Filled 2014-12-10: qty 10

## 2014-12-10 MED ORDER — SODIUM CHLORIDE 0.9 % IV SOLN
INTRAVENOUS | Status: DC
  Administered 2014-12-10: 17:00:00 via INTRAVENOUS

## 2014-12-10 MED ORDER — ONDANSETRON HCL 4 MG/2ML IJ SOLN
4.0000 mg | Freq: Four times a day (QID) | INTRAMUSCULAR | Status: DC | PRN
  Administered 2014-12-11 (×2): 4 mg via INTRAVENOUS
  Filled 2014-12-10 (×2): qty 2

## 2014-12-10 MED ORDER — SODIUM CHLORIDE 0.9 % IV SOLN
INTRAVENOUS | Status: AC
  Administered 2014-12-10: 04:00:00 via INTRAVENOUS

## 2014-12-10 MED ORDER — GLYCOPYRROLATE 0.2 MG/ML IJ SOLN
INTRAMUSCULAR | Status: DC | PRN
  Administered 2014-12-10: .7 mg via INTRAVENOUS

## 2014-12-10 MED ORDER — LIDOCAINE HCL (CARDIAC) 20 MG/ML IV SOLN
INTRAVENOUS | Status: DC | PRN
  Administered 2014-12-10: 100 mg via INTRATRACHEAL
  Administered 2014-12-10: 50 mg via INTRAVENOUS

## 2014-12-10 MED ORDER — KETOROLAC TROMETHAMINE 30 MG/ML IJ SOLN
INTRAMUSCULAR | Status: DC | PRN
Start: 2014-12-10 — End: 2014-12-10
  Administered 2014-12-10: 30 mg via INTRAVENOUS

## 2014-12-10 MED ORDER — PROPOFOL 10 MG/ML IV BOLUS
INTRAVENOUS | Status: AC
  Filled 2014-12-10: qty 20

## 2014-12-10 MED ORDER — 0.9 % SODIUM CHLORIDE (POUR BTL) OPTIME
TOPICAL | Status: DC | PRN
  Administered 2014-12-10: 1000 mL

## 2014-12-10 MED ORDER — DEXTROSE 5 % IV SOLN
2.0000 g | Freq: Four times a day (QID) | INTRAVENOUS | Status: DC
  Administered 2014-12-10 – 2014-12-11 (×3): 2 g via INTRAVENOUS
  Filled 2014-12-10 (×6): qty 2

## 2014-12-10 MED ORDER — ONDANSETRON HCL 4 MG/2ML IJ SOLN
INTRAMUSCULAR | Status: DC | PRN
Start: 2014-12-10 — End: 2014-12-10
  Administered 2014-12-10: 4 mg via INTRAVENOUS

## 2014-12-10 MED ORDER — DEXAMETHASONE SODIUM PHOSPHATE 4 MG/ML IJ SOLN
INTRAMUSCULAR | Status: AC
  Filled 2014-12-10: qty 1

## 2014-12-10 SURGICAL SUPPLY — 41 items
APPLIER CLIP 5 13 M/L LIGAMAX5 (MISCELLANEOUS) ×2
BLADE SURG CLIPPER 3M 9600 (MISCELLANEOUS) IMPLANT
CANISTER SUCTION 2500CC (MISCELLANEOUS) ×2 IMPLANT
CHLORAPREP W/TINT 26ML (MISCELLANEOUS) ×2 IMPLANT
CLIP APPLIE 5 13 M/L LIGAMAX5 (MISCELLANEOUS) ×1 IMPLANT
COVER MAYO STAND STRL (DRAPES) ×2 IMPLANT
COVER SURGICAL LIGHT HANDLE (MISCELLANEOUS) ×2 IMPLANT
DRAPE C-ARM 42X72 X-RAY (DRAPES) ×2 IMPLANT
DRAPE LAPAROSCOPIC ABDOMINAL (DRAPES) ×2 IMPLANT
ELECT REM PT RETURN 9FT ADLT (ELECTROSURGICAL) ×2
ELECTRODE REM PT RTRN 9FT ADLT (ELECTROSURGICAL) ×1 IMPLANT
GLOVE BIO SURGEON STRL SZ7 (GLOVE) ×2 IMPLANT
GLOVE BIO SURGEON STRL SZ7.5 (GLOVE) ×2 IMPLANT
GLOVE BIOGEL PI IND STRL 7.0 (GLOVE) ×3 IMPLANT
GLOVE BIOGEL PI IND STRL 8 (GLOVE) ×1 IMPLANT
GLOVE BIOGEL PI INDICATOR 7.0 (GLOVE) ×3
GLOVE BIOGEL PI INDICATOR 8 (GLOVE) ×1
GLOVE SURG SIGNA 7.5 PF LTX (GLOVE) ×2 IMPLANT
GLOVE SURG SS PI 7.0 STRL IVOR (GLOVE) ×2 IMPLANT
GOWN STRL REUS W/ TWL LRG LVL3 (GOWN DISPOSABLE) ×4 IMPLANT
GOWN STRL REUS W/ TWL XL LVL3 (GOWN DISPOSABLE) ×1 IMPLANT
GOWN STRL REUS W/TWL LRG LVL3 (GOWN DISPOSABLE) ×4
GOWN STRL REUS W/TWL XL LVL3 (GOWN DISPOSABLE) ×1
KIT BASIN OR (CUSTOM PROCEDURE TRAY) ×2 IMPLANT
KIT ROOM TURNOVER OR (KITS) ×2 IMPLANT
LIQUID BAND (GAUZE/BANDAGES/DRESSINGS) ×2 IMPLANT
NS IRRIG 1000ML POUR BTL (IV SOLUTION) ×2 IMPLANT
PAD ARMBOARD 7.5X6 YLW CONV (MISCELLANEOUS) ×2 IMPLANT
POUCH SPECIMEN RETRIEVAL 10MM (ENDOMECHANICALS) ×2 IMPLANT
SCISSORS LAP 5X35 DISP (ENDOMECHANICALS) ×2 IMPLANT
SET CHOLANGIOGRAPH 5 50 .035 (SET/KITS/TRAYS/PACK) ×2 IMPLANT
SET IRRIG TUBING LAPAROSCOPIC (IRRIGATION / IRRIGATOR) ×2 IMPLANT
SLEEVE ENDOPATH XCEL 5M (ENDOMECHANICALS) ×4 IMPLANT
SPECIMEN JAR SMALL (MISCELLANEOUS) ×2 IMPLANT
SUT MON AB 4-0 PC3 18 (SUTURE) ×2 IMPLANT
TOWEL OR 17X24 6PK STRL BLUE (TOWEL DISPOSABLE) ×2 IMPLANT
TOWEL OR 17X26 10 PK STRL BLUE (TOWEL DISPOSABLE) ×2 IMPLANT
TRAY LAPAROSCOPIC (CUSTOM PROCEDURE TRAY) ×2 IMPLANT
TROCAR XCEL BLUNT TIP 100MML (ENDOMECHANICALS) ×2 IMPLANT
TROCAR XCEL NON-BLD 5MMX100MML (ENDOMECHANICALS) ×2 IMPLANT
TUBING INSUFFLATION (TUBING) ×2 IMPLANT

## 2014-12-10 NOTE — Progress Notes (Signed)
Patient ID: Jodi Shah, female   DOB: 1991-03-25, 24 y.o.   MRN: [id number]   Plan lap chole today I discussed the procedure in detail.  .  We discussed the risks and benefits of a laparoscopic cholecystectomy and possible cholangiogram including, but not limited to bleeding, infection, injury to surrounding structures such as the intestine or liver, bile leak, retained gallstones, need to convert to an open procedure, prolonged diarrhea, blood clots such as  DVT, common bile duct injury, anesthesia risks, and possible need for additional procedures.  The likelihood of improvement in symptoms and return to the patient's normal status is good. We discussed the typical post-operative recovery course.

## 2014-12-10 NOTE — Anesthesia Postprocedure Evaluation (Signed)
Anesthesia Post Note  Patient: Jodi Shah  Procedure(s) Performed: Procedure(s) (LRB): LAPAROSCOPIC CHOLECYSTECTOMY  (N/A)  Anesthesia type: General  Patient location: PACU  Post pain: Pain level controlled  Post assessment: Post-op Vital signs reviewed  Last Vitals: BP 115/86 mmHg  Pulse 59  Temp(Src) 36.7 C (Oral)  Resp 12  Ht 5\' 4"  (1.626 m)  Wt 181 lb 8 oz (82.328 kg)  BMI 31.14 kg/m2  SpO2 99%  LMP 12/02/2014 (Approximate)  Post vital signs: Reviewed  Level of consciousness: sedated  Complications: No apparent anesthesia complications

## 2014-12-10 NOTE — Anesthesia Procedure Notes (Signed)

## 2014-12-10 NOTE — Progress Notes (Signed)
One earring given to pt's mother.

## 2014-12-10 NOTE — Op Note (Signed)
Laparoscopic Cholecystectomy Procedure Note  Indications: This patient presents with symptomatic gallbladder disease and will undergo laparoscopic cholecystectomy.  Pre-operative Diagnosis: Calculus of gallbladder with acute cholecystitis, without mention of obstruction  Post-operative Diagnosis: Same  Surgeon: Coralie Keens A   Assistants: 0  Anesthesia: General endotracheal anesthesia  ASA Class: 1  Procedure Details  The patient was seen again in the Holding Room. The risks, benefits, complications, treatment options, and expected outcomes were discussed with the patient. The possibilities of reaction to medication, pulmonary aspiration, perforation of viscus, bleeding, recurrent infection, finding a normal gallbladder, the need for additional procedures, failure to diagnose a condition, the possible need to convert to an open procedure, and creating a complication requiring transfusion or operation were discussed with the patient. The likelihood of improving the patient's symptoms with return to their baseline status is good.  The patient and/or family concurred with the proposed plan, giving informed consent. The site of surgery properly noted. The patient was taken to Operating Room, identified as Jodi Shah and the procedure verified as Laparoscopic Cholecystectomy with Intraoperative Cholangiogram. A Time Out was held and the above information confirmed.  Prior to the induction of general anesthesia, antibiotic prophylaxis was administered. General endotracheal anesthesia was then administered and tolerated well. After the induction, the abdomen was prepped with Chloraprep and draped in sterile fashion. The patient was positioned in the supine position.  Local anesthetic agent was injected into the skin near the umbilicus and an incision made. We dissected down to the abdominal fascia with blunt dissection.  The fascia was incised vertically and we entered the peritoneal cavity  bluntly.  A pursestring suture of 0-Vicryl was placed around the fascial opening.  The Hasson cannula was inserted and secured with the stay suture.  Pneumoperitoneum was then created with CO2 and tolerated well without any adverse changes in the patient's vital signs. A 5-mm port was placed in the subxiphoid position.  Two 5-mm ports were placed in the right upper quadrant. All skin incisions were infiltrated with a local anesthetic agent before making the incision and placing the trocars.   We positioned the patient in reverse Trendelenburg, tilted slightly to the patient's left.  The gallbladder was identified.  It was acutely inflamed and distended.  I had to aspirate bile from the gallbladder in order to grasp it.  The fundus was grasped and retracted cephalad. Adhesions were lysed bluntly and with the electrocautery where indicated, taking care not to injure any adjacent organs or viscus. The infundibulum was grasped and retracted laterally, exposing the peritoneum overlying the triangle of Calot. This was then divided and exposed in a blunt fashion. The cystic duct was clearly identified and bluntly dissected circumferentially. A critical view of the cystic duct and cystic artery was obtained.  The cystic duct was then ligated with clips and divided. The cystic artery was, dissected free, ligated with clips and divided as well.   The gallbladder was dissected from the liver bed in retrograde fashion with the electrocautery. The gallbladder was removed and placed in an Endocatch sac. The liver bed was irrigated and inspected. Hemostasis was achieved with the electrocautery. Copious irrigation was utilized and was repeatedly aspirated until clear.  The gallbladder and Endocatch sac were then removed through the umbilical port site.  The pursestring suture was used to close the umbilical fascia.    We again inspected the right upper quadrant for hemostasis.  Pneumoperitoneum was released as we removed the  trocars.  4-0 Monocryl was used to  close the skin.   Benzoin, steri-strips, and clean dressings were applied. The patient was then extubated and brought to the recovery room in stable condition. Instrument, sponge, and needle counts were correct at closure and at the conclusion of the case.   Findings: Cholecystitis with Cholelithiasis  Estimated Blood Loss: Minimal         Drains: 0         Specimens: Gallbladder           Complications: None; patient tolerated the procedure well.         Disposition: PACU - hemodynamically stable.         Condition: stable

## 2014-12-10 NOTE — Anesthesia Preprocedure Evaluation (Addendum)
Anesthesia Evaluation  Patient identified by MRN, date of birth, ID band Patient awake    Reviewed: Allergy & Precautions, NPO status , Patient's Chart, lab work & pertinent test results  History of Anesthesia Complications Negative for: history of anesthetic complications  Airway Mallampati: II  TM Distance: >3 FB Neck ROM: Full    Dental  (+) Teeth Intact, Dental Advisory Given   Pulmonary Current Smoker,  breath sounds clear to auscultation        Cardiovascular negative cardio ROS  Rhythm:Regular Rate:Normal     Neuro/Psych negative neurological ROS     GI/Hepatic Neg liver ROS, N/v with acute cholelithiasis   Endo/Other  Morbid obesity  Renal/GU negative Renal ROS     Musculoskeletal   Abdominal (+) + obese,   Peds  Hematology   Anesthesia Other Findings   Reproductive/Obstetrics LMP a few weeks ago                          Anesthesia Physical Anesthesia Plan  ASA: II  Anesthesia Plan: General   Post-op Pain Management:    Induction: Intravenous  Airway Management Planned: Oral ETT  Additional Equipment:   Intra-op Plan:   Post-operative Plan: Extubation in OR  Informed Consent: I have reviewed the patients History and Physical, chart, labs and discussed the procedure including the risks, benefits and alternatives for the proposed anesthesia with the patient or authorized representative who has indicated his/her understanding and acceptance.   Dental advisory given  Plan Discussed with: CRNA and Surgeon  Anesthesia Plan Comments: (Plan routine monitors, GETA)        Anesthesia Quick Evaluation

## 2014-12-10 NOTE — ED Notes (Signed)
MD at bedside. 

## 2014-12-10 NOTE — ED Notes (Signed)
Iv attempt x2 unsuccessfully, Dellia Nims, RN in to attempt iv site.

## 2014-12-10 NOTE — H&P (Signed)
Jodi Shah is an 24 y.o. female.   Chief Complaint: gallbladder HPI:  Pt is a 24 yo F who presents with n/v/abd pain since yesterday AM around 10 after eating doughnuts.  She has had similar pain and knew she had gallstones, but previous pain had resolved on its own.  This time pain stayed, and she sought help in the ED.  She did not get a repeat ultrasound, but had a WBC 14 and continues to have tenderness despite pain meds.  She had no fever/chills.  She denies jaundice.     Past Medical History  Diagnosis Date  . Gallstones     History reviewed. No pertinent past surgical history.  No family history on file. Social History:  reports that she has been smoking Cigarettes.  She has been smoking about 0.50 packs per day. She uses smokeless tobacco. She reports that she drinks about 2.4 oz of alcohol per week. She reports that she uses illicit drugs (Marijuana).  Allergies:  Allergies  Allergen Reactions  . Pumpkin Seed Swelling and Rash    Swelling of lips, rash on hands (pulp and seed)    No prescriptions prior to admission    Results for orders placed or performed during the hospital encounter of 12/09/14 (from the past 48 hour(s))  CBC with Differential/Platelet     Status: Abnormal   Collection Time: 12/09/14 11:16 PM  Result Value Ref Range   WBC 14.8 (H) 4.0 - 10.5 K/uL   RBC 4.56 3.87 - 5.11 MIL/uL   Hemoglobin 13.9 12.0 - 15.0 g/dL   HCT 40.1 36.0 - 46.0 %   MCV 87.9 78.0 - 100.0 fL   MCH 30.5 26.0 - 34.0 pg   MCHC 34.7 30.0 - 36.0 g/dL   RDW 12.7 11.5 - 15.5 %   Platelets 340 150 - 400 K/uL   Neutrophils Relative % 93 (H) 43 - 77 %   Neutro Abs 13.8 (H) 1.7 - 7.7 K/uL   Lymphocytes Relative 5 (L) 12 - 46 %   Lymphs Abs 0.7 0.7 - 4.0 K/uL   Monocytes Relative 2 (L) 3 - 12 %   Monocytes Absolute 0.3 0.1 - 1.0 K/uL   Eosinophils Relative 0 0 - 5 %   Eosinophils Absolute 0.0 0.0 - 0.7 K/uL   Basophils Relative 0 0 - 1 %   Basophils Absolute 0.0 0.0 - 0.1 K/uL   Comprehensive metabolic panel     Status: Abnormal   Collection Time: 12/09/14 11:16 PM  Result Value Ref Range   Sodium 140 135 - 145 mmol/L   Potassium 4.6 3.5 - 5.1 mmol/L   Chloride 104 96 - 112 mmol/L   CO2 26 19 - 32 mmol/L   Glucose, Bld 121 (H) 70 - 99 mg/dL   BUN 6 6 - 23 mg/dL   Creatinine, Ser 0.91 0.50 - 1.10 mg/dL   Calcium 9.8 8.4 - 10.5 mg/dL   Total Protein 7.1 6.0 - 8.3 g/dL   Albumin 4.1 3.5 - 5.2 g/dL   AST 22 0 - 37 U/L   ALT 13 0 - 35 U/L   Alkaline Phosphatase 46 39 - 117 U/L   Total Bilirubin 0.7 0.3 - 1.2 mg/dL   GFR calc non Af Amer 89 (L) >90 mL/min   GFR calc Af Amer >90 >90 mL/min    Comment: (NOTE) The eGFR has been calculated using the CKD EPI equation. This calculation has not been validated in all clinical situations. eGFR's persistently <  90 mL/min signify possible Chronic Kidney Disease.    Anion gap 10 5 - 15  Lipase, blood     Status: None   Collection Time: 12/09/14 11:16 PM  Result Value Ref Range   Lipase 16 11 - 59 U/L   No results found.  Review of Systems  Constitutional: Negative.   HENT: Negative.   Eyes: Negative.   Respiratory: Negative.   Cardiovascular: Negative.   Gastrointestinal: Positive for nausea, vomiting and abdominal pain.  Genitourinary: Negative.   Musculoskeletal: Negative.   Skin: Negative.   Neurological: Negative.   Endo/Heme/Allergies: Negative.   Psychiatric/Behavioral: Negative.     Blood pressure 115/64, pulse 73, temperature 98.5 F (36.9 C), temperature source Oral, resp. rate 14, height _0  (1.626 m), weight 181 lb 8 oz (82.328 kg), last menstrual period 12/02/2014, SpO2 100 %. Physical Exam  Constitutional: She is oriented to person, place, and time. She appears well-developed and well-nourished. No distress.  HENT:  Head: Normocephalic and atraumatic.  Eyes: Conjunctivae are normal. Pupils are equal, round, and reactive to light. Right eye exhibits no discharge. Left eye exhibits no  discharge. No scleral icterus.  Neck: Neck supple.  Cardiovascular: Normal rate and intact distal pulses.   Respiratory: Effort normal. No respiratory distress. She exhibits no tenderness.  GI: Soft. She exhibits no distension. There is tenderness (RUQ/epigastric). There is no rebound and no guarding.  Musculoskeletal: Normal range of motion. She exhibits no edema or tenderness.  Neurological: She is alert and oriented to person, place, and time.  Skin: Skin is warm and dry. No rash noted. She is not diaphoretic. No erythema. No pallor.  Psychiatric: She has a normal mood and affect. Her behavior is normal. Judgment and thought content normal.     Assessment/Plan Acute calculous cholecystitis NPO IVF To OR for lap chole.  The surgical procedure was described to the patient in detail.  The patient was given educational material.  I discussed the incision type and location, the location of the gallbladder, the anatomy of the bile ducts and arteries, and the typical progression of surgery.  I discussed the possibility of converting to an open operation.  I advised of the risks of bleeding, infection, damage to other structures (such as the bile duct, intestine or liver), bile leak, need for other procedures or surgeries, and post op diarrhea/constipation.  We discussed the risk of blood clot.  We discussed the recovery period and post operative restrictions.  The patient was advised against taking blood thinners the week before surgery.       [place] 12/10/2014, 6:47 AM

## 2014-12-10 NOTE — Progress Notes (Addendum)
New Admission Note:    Arrival Method:  Via stretcher Mental Orientation: A&Ox4  Telemetry: n/a Assessment: Completed Skin: tattoos on mid upper back and left shin IV: NS@125x12hrs  Pain: 4 out of 10 in RUQ Tubes: N/A Safety Measures: Safety Fall Prevention Plan has been given, discussed and signed Admission: Completed [facility] [place] Orientation: Patient has been orientated to the room, unit and staff.  Family: None present  MD was paged to put admission orders in, awaiting orders. Will continue to monitor the patient. Call light has been placed within reach and bed alarm has been activated.   Dudley Major BSN, Consulting civil engineer number: [phone]

## 2014-12-10 NOTE — Addendum Note (Signed)
Addendum  created 12/10/14 1545 by Jenne Campus, CRNA   Modules edited: Anesthesia Review and Sign Navigator Section, Clinical Notes   Clinical Notes:  File: [id number]

## 2014-12-10 NOTE — ED Notes (Signed)
Iv team at bedside  

## 2014-12-10 NOTE — Progress Notes (Signed)
UR completed 

## 2014-12-10 NOTE — Transfer of Care (Signed)
Immediate Anesthesia Transfer of Care Note  Patient: Jodi Shah  Procedure(s) Performed: Procedure(s): LAPAROSCOPIC CHOLECYSTECTOMY  (N/A)  Patient Location: PACU  Anesthesia Type:General  Level of Consciousness: awake, alert , oriented and patient cooperative  Airway & Oxygen Therapy: Patient Spontanous Breathing and Patient connected to nasal cannula oxygen  Post-op Assessment: Report given to RN and Post -op Vital signs reviewed and stable  Post vital signs: Reviewed  Last Vitals:  Filed Vitals:   12/10/14 0500  BP: 115/64  Pulse: 73  Temp: 36.9 C  Resp: 14    Complications: No apparent anesthesia complications

## 2014-12-10 NOTE — Progress Notes (Signed)
INITIAL NUTRITION ASSESSMENT  DOCUMENTATION CODES Per approved criteria  -Obesity Unspecified   INTERVENTION: Provide Resource Breeze po BID, each supplement provides 250 kcal and 9 grams of protein.  Encourage adequate PO intake.  NUTRITION DIAGNOSIS: Increased nutrient needs related to s/p surgery as evidenced by estimated nutrition needs.   Goal: Pt to meet >/= 90% of their estimated nutrition needs   Monitor:  Diet advancement, weight trends, labs, I/O's  Reason for Assessment: MST  24 y.o. female  Admitting Dx: <principal problem not specified>  ASSESSMENT: Pt with n/v/abd pain since yesterday AM around 10 after eating doughnuts. She has had similar pain and knew she had gallstones, but previous pain had resolved on its own.   Procedure(3/14) (LRB): LAPAROSCOPIC CHOLECYSTECTOMY (N/A)  Pt has been advanced to a clear liquid diet. Pt reports little abdominal pains. She reports PTA she was eating fine with no other difficulties. Weight has been stable with usual body weight of 180-185 lbs. Pt is agreeable to Resource Breeze to aid in caloric and protein needs. RD to order. Pt was encouraged to consumer her foods at meals.  Pt with no observed significant fat or muscle mass loss.  Labs and medications reviewed.  Height: Ht Readings from Last 1 Encounters:  12/10/14 5\' 4"  (1.626 m)    Weight: Wt Readings from Last 1 Encounters:  12/10/14 181 lb 8 oz (82.328 kg)    Ideal Body Weight: 120 lbs  % Ideal Body Weight: 151%  Wt Readings from Last 10 Encounters:  12/10/14 181 lb 8 oz (82.328 kg)    Usual Body Weight: 180-185 lbs  % Usual Body Weight: 100%  BMI:  Body mass index is 31.14 kg/(m^2). Class I obesity  Estimated Nutritional Needs: Kcal: 1900-2100 Protein: 95-110 grams Fluid: 2 - 2.2 L/day  Skin: Incision on abdomen  Diet Order: Diet NPO time specified  EDUCATION NEEDS: -No education needs identified at this time  No intake or output data  in the 24 hours ending 12/10/14 1035  Last BM: 3/13  Labs:   Recent Labs Lab 12/09/14 2316  NA 140  K 4.6  CL 104  CO2 26  BUN 6  CREATININE 0.91  CALCIUM 9.8  GLUCOSE 121*    CBG (last 3)  No results for input(s): GLUCAP in the last 72 hours.  Scheduled Meds: . sodium chloride   Intravenous STAT  . [MAR Hold] cefOXitin  2 g Intravenous 4 times per day  . sodium chloride  1,000 mL Intravenous Once    Continuous Infusions: . sodium chloride Stopped (12/09/14 2353)  . dextrose 5 % and 0.45 % NaCl with KCl 20 mEq/L 100 mL/hr at 12/10/14 9371    Past Medical History  Diagnosis Date  . Gallstones     History reviewed. No pertinent past surgical history.  Kallie Locks, MS, RD, LDN Pager # [phone] After hours/ weekend pager # [phone]

## 2014-12-11 ENCOUNTER — Encounter (HOSPITAL_COMMUNITY): Payer: Self-pay | Admitting: Surgery

## 2014-12-11 LAB — COMPREHENSIVE METABOLIC PANEL
ALT: 33 U/L (ref 0–35)
AST: 51 U/L — AB (ref 0–37)
Albumin: 3.1 g/dL — ABNORMAL LOW (ref 3.5–5.2)
Alkaline Phosphatase: 36 U/L — ABNORMAL LOW (ref 39–117)
Anion gap: 5 (ref 5–15)
BUN: 5 mg/dL — ABNORMAL LOW (ref 6–23)
CALCIUM: 8.6 mg/dL (ref 8.4–10.5)
CO2: 26 mmol/L (ref 19–32)
CREATININE: 0.85 mg/dL (ref 0.50–1.10)
Chloride: 108 mmol/L (ref 96–112)
GFR calc Af Amer: >90 mL/min (ref >90)
GFR calc non Af Amer: >90 mL/min (ref >90)
Glucose, Bld: 90 mg/dL (ref 70–99)
Potassium: 3.9 mmol/L (ref 3.5–5.1)
Sodium: 139 mmol/L (ref 135–145)
TOTAL PROTEIN: 5.5 g/dL — AB (ref 6.0–8.3)
Total Bilirubin: 1.1 mg/dL (ref 0.3–1.2)

## 2014-12-11 LAB — CBC
HCT: 35.7 % — ABNORMAL LOW (ref 36.0–46.0)
HEMOGLOBIN: 11.6 g/dL — AB (ref 12.0–15.0)
MCH: 29.6 pg (ref 26.0–34.0)
MCHC: 32.5 g/dL (ref 30.0–36.0)
MCV: 91.1 fL (ref 78.0–100.0)
Platelets: 273 10*3/uL (ref 150–400)
RBC: 3.92 MIL/uL (ref 3.87–5.11)
RDW: 13 % (ref 11.5–15.5)
WBC: 8.2 10*3/uL (ref 4.0–10.5)

## 2014-12-11 MED ORDER — ONDANSETRON HCL 4 MG PO TABS
4.0000 mg | ORAL_TABLET | Freq: Three times a day (TID) | ORAL | Status: DC | PRN
Start: 2014-12-11 — End: 2021-03-17

## 2014-12-11 MED ORDER — IBUPROFEN 600 MG PO TABS: 600.0000 mg | ORAL_TABLET | Freq: Three times a day (TID) | ORAL | Status: DC | PRN

## 2014-12-11 MED ORDER — IBUPROFEN 200 MG PO TABS: 600.0000 mg | ORAL_TABLET | Freq: Three times a day (TID) | ORAL | Status: DC | PRN

## 2014-12-11 MED ORDER — OXYCODONE-ACETAMINOPHEN 5-325 MG PO TABS: 1.0000 | ORAL_TABLET | Freq: Four times a day (QID) | ORAL | Status: DC | PRN

## 2014-12-11 NOTE — Progress Notes (Addendum)
Patient to be discharged to home. PIV removed. Discharge instructions, follow up appts, and new prescriptions reviewed with patient. Patient educated on incision site care.  Joellen Jersey, RN.

## 2014-12-11 NOTE — Progress Notes (Signed)
Patient ID: Jodi Shah, female   DOB: October 02, 1990, 24 y.o.   MRN: [id number]     [address] [place], [place] [address]    Phone: [phone] FAX: [phone]     Subjective: No n/v.  Sore.  Passing flatus.  Tolerating fulls, but had nausea earlier. Walking.  Afebrile.  VSS.    Objective:  Vital signs:  Filed Vitals:   12/10/14 1827 12/10/14 2142 12/11/14 0500 12/11/14 0849  BP: 118/69 114/71 100/56 102/79  Pulse: 64 58 64 85  Temp: 98.1 F (36.7 C) 98.2 F (36.8 C) 97.6 F (36.4 C) 97.9 F (36.6 C)  TempSrc: Oral Oral Oral Oral  Resp:  _0 Height:  _1  (1.626 m)    Weight:  183 lb 2.2 oz (83.07 kg)    SpO2: 98% 100% 97% 98%    Last BM Date: 12/09/14  Intake/Output   Yesterday:  03/14 0701 - 03/15 0700 In: 3228.3 [P.O.:360; I.V.:2768.3; IV Piggyback:100] Out: 0  This shift:    I/O last 3 completed shifts: In: 3228.3 [P.O.:360; I.V.:2768.3; IV Piggyback:100] Out: 0     Physical Exam: General: Pt awake/alert/oriented x4 in no acute distress  Abdomen: Soft.  Nondistended.  Mildly tender at incisions only.  Incisions are c/d/i.  No evidence of peritonitis.  No incarcerated hernias.   Problem List:   Active Problems:   Gallstones   Acute calculous cholecystitis    Results:   Labs: Results for orders placed or performed during the hospital encounter of 12/09/14 (from the past 48 hour(s))  CBC with Differential/Platelet     Status: Abnormal   Collection Time: 12/09/14 11:16 PM  Result Value Ref Range   WBC 14.8 (H) 4.0 - 10.5 K/uL   RBC 4.56 3.87 - 5.11 MIL/uL   Hemoglobin 13.9 12.0 - 15.0 g/dL   HCT 40.1 36.0 - 46.0 %   MCV 87.9 78.0 - 100.0 fL   MCH 30.5 26.0 - 34.0 pg   MCHC 34.7 30.0 - 36.0 g/dL   RDW 12.7 11.5 - 15.5 %   Platelets 340 150 - 400 K/uL   Neutrophils Relative % 93 (H) 43 - 77 %   Neutro Abs 13.8 (H) 1.7 - 7.7 K/uL   Lymphocytes Relative 5 (L) 12 - 46 %    Lymphs Abs 0.7 0.7 - 4.0 K/uL   Monocytes Relative 2 (L) 3 - 12 %   Monocytes Absolute 0.3 0.1 - 1.0 K/uL   Eosinophils Relative 0 0 - 5 %   Eosinophils Absolute 0.0 0.0 - 0.7 K/uL   Basophils Relative 0 0 - 1 %   Basophils Absolute 0.0 0.0 - 0.1 K/uL  Comprehensive metabolic panel     Status: Abnormal   Collection Time: 12/09/14 11:16 PM  Result Value Ref Range   Sodium 140 135 - 145 mmol/L   Potassium 4.6 3.5 - 5.1 mmol/L   Chloride 104 96 - 112 mmol/L   CO2 26 19 - 32 mmol/L   Glucose, Bld 121 (H) 70 - 99 mg/dL   BUN 6 6 - 23 mg/dL   Creatinine, Ser 0.91 0.50 - 1.10 mg/dL   Calcium 9.8 8.4 - 10.5 mg/dL   Total Protein 7.1 6.0 - 8.3 g/dL   Albumin 4.1 3.5 - 5.2 g/dL   AST 22 0 - 37 U/L   ALT 13 0 - 35 U/L   Alkaline Phosphatase 46 39 -  117 U/L   Total Bilirubin 0.7 0.3 - 1.2 mg/dL   GFR calc non Af Amer 89 (L) >90 mL/min   GFR calc Af Amer >90 >90 mL/min    Comment: (NOTE) The eGFR has been calculated using the CKD EPI equation. This calculation has not been validated in all clinical situations. eGFR's persistently <90 mL/min signify possible Chronic Kidney Disease.    Anion gap 10 5 - 15  Lipase, blood     Status: None   Collection Time: 12/09/14 11:16 PM  Result Value Ref Range   Lipase 16 11 - 59 U/L  hCG, serum, qualitative     Status: None   Collection Time: 12/10/14 10:59 AM  Result Value Ref Range   Preg, Serum NEGATIVE NEGATIVE    Comment:        THE SENSITIVITY OF THIS METHODOLOGY IS >10 mIU/mL.   Comprehensive metabolic panel     Status: Abnormal   Collection Time: 12/11/14  6:05 AM  Result Value Ref Range   Sodium 139 135 - 145 mmol/L   Potassium 3.9 3.5 - 5.1 mmol/L   Chloride 108 96 - 112 mmol/L   CO2 26 19 - 32 mmol/L   Glucose, Bld 90 70 - 99 mg/dL   BUN 5 (L) 6 - 23 mg/dL   Creatinine, Ser 0.85 0.50 - 1.10 mg/dL   Calcium 8.6 8.4 - 10.5 mg/dL   Total Protein 5.5 (L) 6.0 - 8.3 g/dL   Albumin 3.1 (L) 3.5 - 5.2 g/dL   AST 51 (H) 0 - 37 U/L    ALT 33 0 - 35 U/L   Alkaline Phosphatase 36 (L) 39 - 117 U/L   Total Bilirubin 1.1 0.3 - 1.2 mg/dL   GFR calc non Af Amer >90 >90 mL/min   GFR calc Af Amer >90 >90 mL/min    Comment: (NOTE) The eGFR has been calculated using the CKD EPI equation. This calculation has not been validated in all clinical situations. eGFR's persistently <90 mL/min signify possible Chronic Kidney Disease.    Anion gap 5 5 - 15  CBC     Status: Abnormal   Collection Time: 12/11/14  6:05 AM  Result Value Ref Range   WBC 8.2 4.0 - 10.5 K/uL   RBC 3.92 3.87 - 5.11 MIL/uL   Hemoglobin 11.6 (L) 12.0 - 15.0 g/dL   HCT 35.7 (L) 36.0 - 46.0 %   MCV 91.1 78.0 - 100.0 fL   MCH 29.6 26.0 - 34.0 pg   MCHC 32.5 30.0 - 36.0 g/dL   RDW 13.0 11.5 - 15.5 %   Platelets 273 150 - 400 K/uL    Imaging / Studies: No results found.  Medications / Allergies:  Scheduled Meds: . cefOXitin  2 g Intravenous 4 times per day  . feeding supplement (RESOURCE BREEZE)  1 Container Oral BID BM  . sodium chloride  1,000 mL Intravenous Once   Continuous Infusions: . sodium chloride 75 mL/hr at 12/11/14 0000  . lactated ringers Stopped (12/10/14 1654)   PRN Meds:.acetaminophen **OR** acetaminophen, diphenhydrAMINE **OR** diphenhydrAMINE, morphine injection, ondansetron, oxyCODONE-acetaminophen  Antibiotics: Anti-infectives    Start     Dose/Rate Route Frequency Ordered Stop   12/10/14 2200  cefOXitin (MEFOXIN) 2 g in dextrose 5 % 50 mL IVPB     2 g 100 mL/hr over 30 Minutes Intravenous 4 times per day 12/10/14 1757     12/10/14 0700  cefOXitin (MEFOXIN) 2 g in dextrose 5 % 50 mL  IVPB  Status:  Discontinued     2 g 100 mL/hr over 30 Minutes Intravenous 4 times per day 12/10/14 1282 12/10/14 1757        Assessment/Plan Acute cholecystitis with cholelithiasis  POD#1 laparoscopic cholecystectomy---Dr. Ninfa Linden  -advance diet -mobilize  -add IS -may DC IVF -SCD -ibuprofen  -anticipate DC later today, if nausea  subsides   Erby Pian, [facility] Pager [phone](7A-4:30P) For consults and floor pages call [phone](7A-4:30P)  12/11/2014 9:14 AM

## 2014-12-11 NOTE — Discharge Instructions (Signed)

## 2014-12-11 NOTE — Discharge Summary (Signed)
  Physician Discharge Summary  Patient ID: Jodi Shah MRN: [id number] DOB/AGE: [birth year] 24 y.o.  Admit date: 12/09/2014 Discharge date: 12/11/2014  Admitting Diagnosis: Acute calculous cholecystitis   Discharge Diagnosis Patient Active Problem List   Diagnosis Date Noted  . Gallstones 12/10/2014  . Acute calculous cholecystitis 12/10/2014    Consultants none  Imaging: No results found.  Procedures Laparoscopic cholecystectomy---Dr. Ninfa Linden 12/10/14  Hospital Course:  Jodi Shah is a healthy 24 year old female who presented to [facility] with abdominal pain.  Workup showed acute cholecystitis.  Patient was admitted and underwent procedure listed above.  Tolerated procedure well and was transferred to the floor.  Diet was advanced as tolerated.  On POD#1, the patient was voiding well, tolerating diet, ambulating well, pain well controlled, vital signs stable, incisions c/d/i and felt stable for discharge home. Medication risks, benefits and therapeutic alternatives were reviewed with the patient.  She verbalizes understanding.   Patient will follow up in our office in 3 weeks and knows to call with questions or concerns.     Medication List    TAKE these medications        ibuprofen 600 MG tablet  Commonly known as:  ADVIL,MOTRIN  Take 1 tablet (600 mg total) by mouth 3 (three) times daily as needed for mild pain or moderate pain.     ondansetron 4 MG tablet  Commonly known as:  ZOFRAN  Take 1 tablet (4 mg total) by mouth every 8 (eight) hours as needed for nausea.     oxyCODONE-acetaminophen 5-325 MG per tablet  Commonly known as:  PERCOCET/ROXICET  Take 1-2 tablets by mouth every 6 (six) hours as needed for moderate pain.             Follow-up Information    Follow up with [facility] On 01/01/2015.   Specialty:  General Surgery   Why:  Doc of the [facility], 2:00pm, arrive no later than 1:30pm for paperwork   Contact information:   [address] [place] [address] [phone]       Signed: Erby Pian, [facility] [phone]  12/11/2014, 2:03 PM

## 2016-11-23 ENCOUNTER — Other Ambulatory Visit: Payer: Self-pay | Admitting: Otolaryngology

## 2016-11-23 DIAGNOSIS — M542 Cervicalgia: Secondary | ICD-10-CM

## 2016-12-18 ENCOUNTER — Ambulatory Visit
Admission: RE | Admit: 2016-12-18 | Discharge: 2016-12-18 | Disposition: A | Discharge status: Home / Self Care | Payer: BLUE CROSS/BLUE SHIELD | Source: Ambulatory Visit | Attending: Otolaryngology | Admitting: Otolaryngology

## 2016-12-18 DIAGNOSIS — M542 Cervicalgia: Secondary | ICD-10-CM

## 2016-12-18 IMAGING — CT CT NECK W/ CM
3 of 6 series · 9 of 20 positions shown, 10 images · IV contrast (75CC ISOVUE 300)
Comparison: None.

CLINICAL DATA: Palpable mass on the right near the angle of the
mandible. Noticed for several years but enlarging and becoming
painful.

EXAM:
CT NECK WITH CONTRAST
TECHNIQUE: Multidetector CT imaging of the neck was performed using the
standard protocol following the bolus administration of intravenous
contrast.
CONTRAST:  75mL [2C] IOPAMIDOL ([2C]) INJECTION 61%

[Series 3: axial neck · axial · 0.39mm/px · z∈[+138,+206]mm · 2 of 83 slices shown]
[im 28/83  bone]
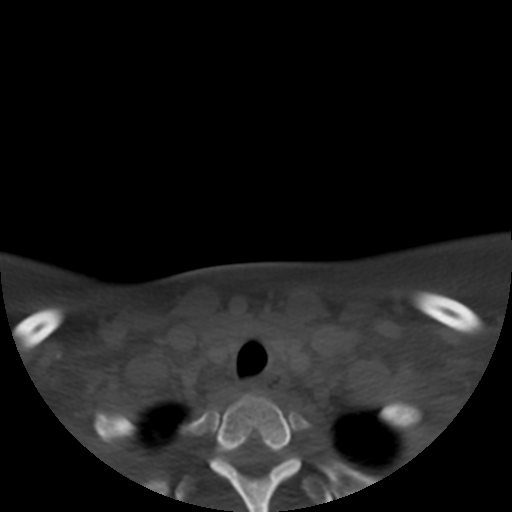
[im 55/83  bone]
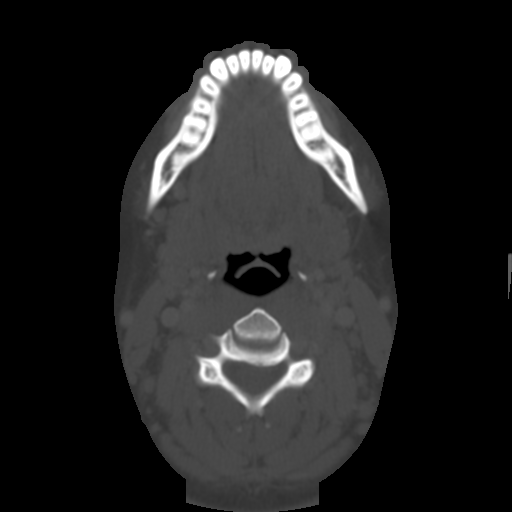

[Series 200: coronal · coronal · 0.41mm/px · 3 of 93 slices shown]
[im 7/93  bone]
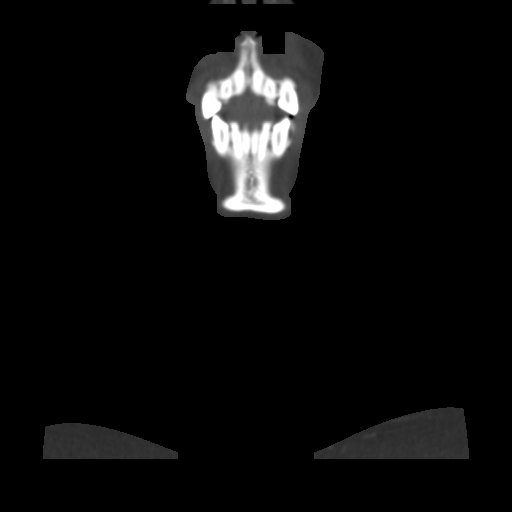
[im 47/93  bone]
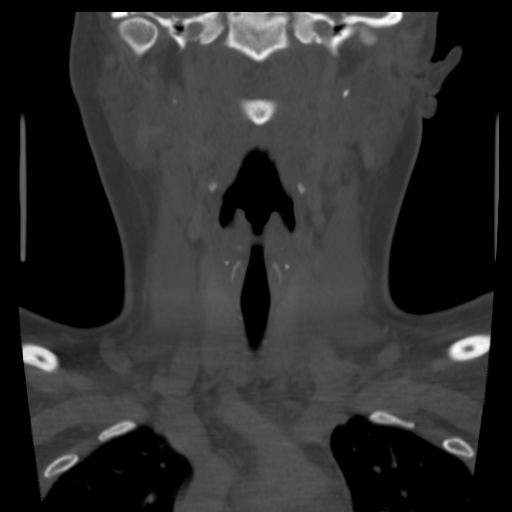
[im 87/93  bone]
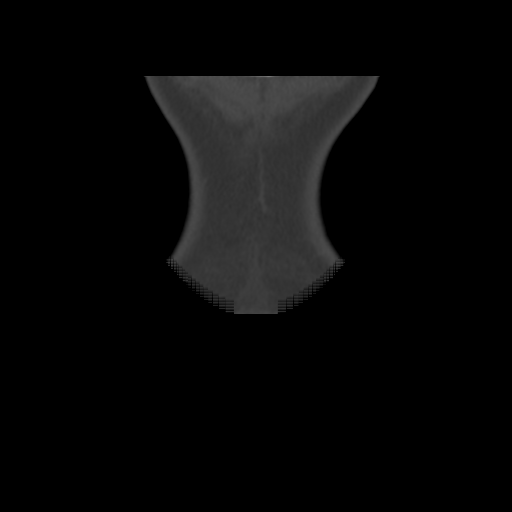

[Series 202: angled to hyoid · axial · 0.41mm/px · z∈[+61,+200]mm · 4 of 128 slices shown, 5 images]
[im 26/128  soft-tissue]
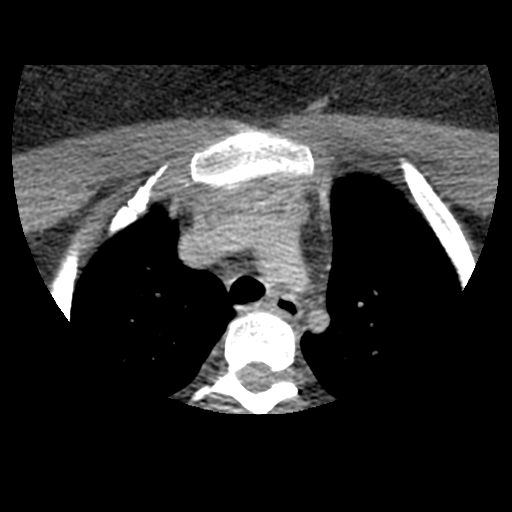
[im 26/128  bone]
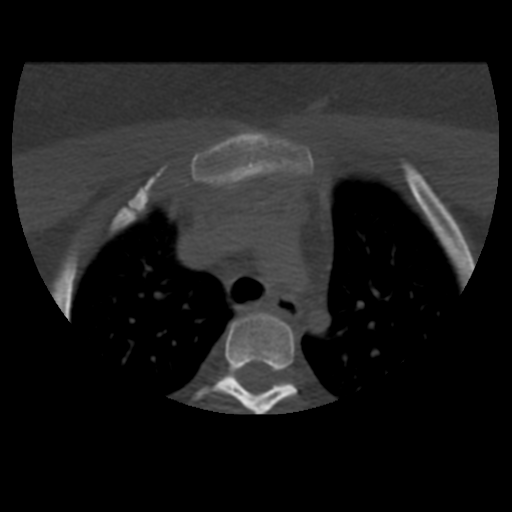
[im 51/128  bone]
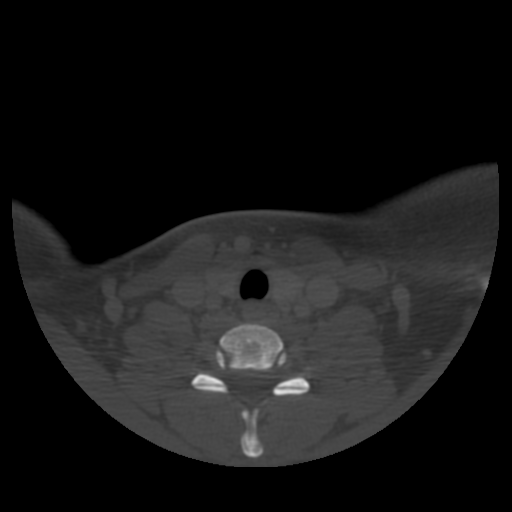
[im 77/128  bone]
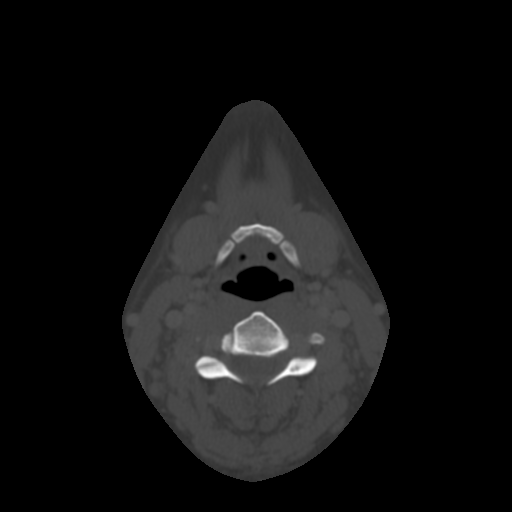
[im 102/128  bone]
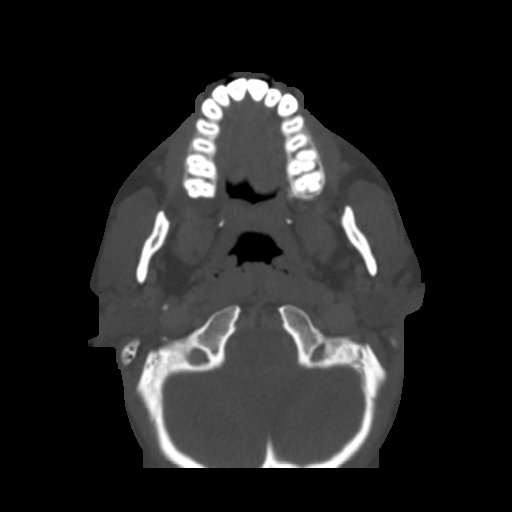

[9 of 20 positions shown; findings below may reference images not displayed]

FINDINGS: Pharynx and larynx: No mucosal or submucosal lesion.

Salivary glands: Left parotid gland is normal. Both submandibular
glands are normal. At the inferior aspect of the right parotid, 1
can appreciate a 9 mm nearly isodense rounded area is most likely to
represent an intra parotid lymph node. In a person of this age,
parotid neoplasm would be unlikely. Additionally, there are numerous
mildly enlarged lymph nodes on both sides of the neck suggesting
that we are dealing with a systemic process resulting and mild nodal
enlargement. There is no evidence of dominant or low-density node.

Thyroid: Normal

Lymph nodes: Nodal prominence in both sides of the neck as discussed
above.

Vascular: Normal

Limited intracranial: Normal

Visualized orbits: Normal

Mastoids and visualized paranasal sinuses: Clear

Skeleton: Normal

Upper chest: Clear

Other: None
IMPRESSION: Skin marker overlying the area of concern correlates with the
inferior aspect of the superficial lobe of the right parotid gland.
Within that, there is a 9 mm nearly isodense structure that is most
likely to represent an intra parotid lymph node in a person of this
age. Additional evidence in favor that diagnosis is a generalized
prominence of the lymph nodes throughout the neck bilaterally
without dominant enlarged node or low-density node. Most likely
diagnosis is lymphatic system reactivity to some sort of systemic
infection. I think this could be followed clinically. If this does
not resolve, this could be reimaged in 3-6 months. Because of the
low conspicuity, 1 might consider MRI.

## 2016-12-18 MED ORDER — IOPAMIDOL (ISOVUE-300) INJECTION 61%
75.0000 mL | Freq: Once | INTRAVENOUS | Status: AC | PRN
Start: 2016-12-18 — End: 2016-12-18
  Administered 2016-12-18: 75 mL via INTRAVENOUS

## 2017-03-12 ENCOUNTER — Other Ambulatory Visit: Payer: Self-pay | Admitting: Physician Assistant

## 2017-03-12 DIAGNOSIS — M542 Cervicalgia: Secondary | ICD-10-CM

## 2020-10-02 ENCOUNTER — Other Ambulatory Visit: Payer: BLUE CROSS/BLUE SHIELD

## 2020-10-02 DIAGNOSIS — Z20822 Contact with and (suspected) exposure to covid-19: Secondary | ICD-10-CM

## 2020-10-03 LAB — NOVEL CORONAVIRUS, NAA: SARS-CoV-2, NAA: NOT DETECTED

## 2020-10-03 LAB — SARS-COV-2, NAA 2 DAY TAT

## 2021-03-17 ENCOUNTER — Encounter: Payer: Self-pay | Admitting: Nurse Practitioner

## 2021-03-17 ENCOUNTER — Other Ambulatory Visit: Payer: Self-pay

## 2021-03-17 ENCOUNTER — Other Ambulatory Visit: Payer: Self-pay | Admitting: Nurse Practitioner

## 2021-03-17 ENCOUNTER — Ambulatory Visit (INDEPENDENT_AMBULATORY_CARE_PROVIDER_SITE_OTHER): Payer: 59 | Admitting: Nurse Practitioner

## 2021-03-17 VITALS — BP 118/80 | HR 61 | Temp 98.1°F | Ht 64.6 in | Wt 200.6 lb

## 2021-03-17 DIAGNOSIS — Z720 Tobacco use: Secondary | ICD-10-CM

## 2021-03-17 DIAGNOSIS — Z23 Encounter for immunization: Secondary | ICD-10-CM | POA: Diagnosis not present

## 2021-03-17 DIAGNOSIS — Z1159 Encounter for screening for other viral diseases: Secondary | ICD-10-CM | POA: Diagnosis not present

## 2021-03-17 DIAGNOSIS — Z114 Encounter for screening for human immunodeficiency virus [HIV]: Secondary | ICD-10-CM | POA: Diagnosis not present

## 2021-03-17 DIAGNOSIS — D490 Neoplasm of unspecified behavior of digestive system: Secondary | ICD-10-CM

## 2021-03-17 DIAGNOSIS — E559 Vitamin D deficiency, unspecified: Secondary | ICD-10-CM

## 2021-03-17 DIAGNOSIS — Z6833 Body mass index (BMI) 33.0-33.9, adult: Secondary | ICD-10-CM

## 2021-03-17 DIAGNOSIS — Z0000 Encounter for general adult medical examination without abnormal findings: Secondary | ICD-10-CM

## 2021-03-17 DIAGNOSIS — E6609 Other obesity due to excess calories: Secondary | ICD-10-CM

## 2021-03-17 DIAGNOSIS — Z7689 Persons encountering health services in other specified circumstances: Secondary | ICD-10-CM

## 2021-03-17 MED ORDER — NICOTINE 21-14-7 MG/24HR TD KIT: 1.0000 | PACK | Freq: Every day | TRANSDERMAL | 0 refills | Status: DC

## 2021-03-17 NOTE — Progress Notes (Signed)
I,Yamilka Roman [employer] as a Education administrator for [employer], FNP.,have documented all relevant documentation on the behalf of Minette Brine, FNP,as directed by  Minette Brine, FNP while in the presence of Minette Brine, [place].  This visit occurred during the SARS-CoV-2 public health emergency.  Safety protocols were in place, including screening questions prior to the visit, additional usage of staff PPE, and extensive cleaning of exam room while observing appropriate contact time as indicated for disinfecting solutions.  Subjective:     Patient ID: Jodi Shah , female    DOB: 1990-10-01 , 30 y.o.   MRN: [id number]   Chief Complaint  Patient presents with   Establish Care   Annual Exam    HPI  Patient presents today to establish primary care. She has not seen a PCP in more than 3 years. 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  Divorced. No children.   She would like to get a physical today and also have an order for a CT scan. 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 She thinks her last CT scan was 2019. She did not have any chemo or radiation, margins looked good and she had tumor removed. She was advised to have a CT scan every 6 months.   Wt Readings from Last 3 Encounters: 03/17/21 : 200 lb 9.6 oz (91 kg) 12/10/14 : 183 lb 2.2 oz (83.1 kg)      Past Medical History:  Diagnosis Date   Gallstones    Vitamin D deficiency      Family History  Problem Relation Age of Onset   Healthy Mother    Healthy Father    Cirrhosis Maternal Grandmother    Healthy Maternal Grandfather      Current Outpatient Medications:    Nicotine 21-14-7 MG/24HR KIT, Place 1 patch onto the skin daily., Disp: 1 kit, Rfl: 0   Vitamin D, Ergocalciferol, (DRISDOL) 1.25 MG (50000 UNIT) CAPS capsule, Take 1 capsule (50,000 Units total) by mouth every 7 (seven) days., Disp: 12 capsule, Rfl: 0   Allergies  Allergen  Reactions   Pumpkin Seed Swelling and Rash    Swelling of lips, rash on hands (pulp and seed)      The patient states she uses IUD for birth control. No menstrual with IUD. No LMP recorded. (Menstrual status: IUD).. Negative for Dysmenorrhea and Negative for Menorrhagia. Negative for: breast discharge, breast lump(s), breast pain and breast self exam. Associated symptoms include abnormal vaginal bleeding. Pertinent negatives include abnormal bleeding (hematology), anxiety, decreased libido, depression, difficulty falling sleep, dyspareunia, history of infertility, nocturia, sexual dysfunction, sleep disturbances, urinary incontinence, urinary urgency, vaginal discharge and vaginal itching. Diet regular.The patient states her exercise level is minimal she reports walking a lot.   The patient's tobacco use is:  Social History   Tobacco Use  Smoking Status Every Day   Packs/day: 0.50   Pack years: 0.00   Types: Cigarettes  Smokeless Tobacco Current   She has been exposed to passive smoke. The patient's alcohol use is:  Social History   Substance and Sexual Activity  Alcohol Use Yes   Alcohol/week: 4.0 standard drinks   Types: 4 Glasses of wine per week   Comment: socially only   . Additional information: Last pap November 2021, next one scheduled for November 2022 per patitne.    Review of Systems  Constitutional: Negative.   HENT:  Negative.    Eyes: Negative.   Respiratory: Negative.    Cardiovascular: Negative.   Gastrointestinal: Negative.   Endocrine: Negative.   Genitourinary: Negative.   Musculoskeletal: Negative.   Skin: Negative.   Allergic/Immunologic: Negative.   Neurological: Negative.   Hematological: Negative.   Psychiatric/Behavioral: Negative.      Today's Vitals   03/17/21 1539  BP: 118/80  Pulse: 61  Temp: 98.1 F (36.7 C)  SpO2: 98%  Weight: 200 lb 9.6 oz (91 kg)  Height: 5' 4.6" (1.641 m)  PainSc: 0-No pain   Body mass index is 33.8 kg/m.    Objective:  Physical Exam Vitals reviewed.  Constitutional:      General: She is not in acute distress.    Appearance: Normal appearance. She is well-developed. She is obese.  HENT:     Head: Normocephalic and atraumatic.     Right Ear: Hearing, tympanic membrane, ear canal and external ear normal. There is no impacted cerumen.     Left Ear: Hearing, tympanic membrane, ear canal and external ear normal. There is no impacted cerumen.     Nose:     Comments: Deferred - masked    Mouth/Throat:     Comments: Deferred - masked Eyes:     General: Lids are normal.     Extraocular Movements: Extraocular movements intact.     Conjunctiva/sclera: Conjunctivae normal.     Pupils: Pupils are equal, round, and reactive to light.     Funduscopic exam:    Right eye: No papilledema.        Left eye: No papilledema.  Neck:     Thyroid: No thyroid mass.     Vascular: No carotid bruit.  Cardiovascular:     Rate and Rhythm: Normal rate and regular rhythm.     Pulses: Normal pulses.     Heart sounds: Normal heart sounds. No murmur heard. Pulmonary:     Effort: Pulmonary effort is normal.     Breath sounds: Normal breath sounds.  Chest:     Chest wall: No mass.  Breasts:    Tanner Score is 5.     Right: Normal. No mass, tenderness, axillary adenopathy or supraclavicular adenopathy.     Left: Normal. No mass, tenderness, axillary adenopathy or supraclavicular adenopathy.  Abdominal:     General: Abdomen is flat. Bowel sounds are normal. There is no distension.     Palpations: Abdomen is soft.     Tenderness: There is no abdominal tenderness.  Genitourinary:    Rectum: Guaiac result negative.  Musculoskeletal:        General: No swelling. Normal range of motion.     Cervical back: Full passive range of motion without pain, normal range of motion and neck supple.     Right lower leg: No edema.     Left lower leg: No edema.  Lymphadenopathy:     Upper Body:     Right upper body: No  supraclavicular, axillary or pectoral adenopathy.     Left upper body: No supraclavicular, axillary or pectoral adenopathy.  Skin:    General: Skin is warm and dry.     Capillary Refill: Capillary refill takes less than 2 seconds.  Neurological:     General: No focal deficit present.     Mental Status: She is alert and oriented to person, place, and time.     Cranial Nerves: No cranial nerve deficit.     Sensory: No sensory deficit.  Psychiatric:  Mood and Affect: Mood normal.        Behavior: Behavior normal.        Thought Content: Thought content normal.        Judgment: Judgment normal.        Assessment And Plan:     1. Encounter for general adult medical examination w/o abnormal findings Behavior modifications discussed and diet history reviewed.   Pt will continue to exercise regularly and modify diet with low GI, plant based foods and decrease intake of processed foods.  Recommend intake of daily multivitamin, Vitamin D, and calcium.  Recommend mammogram and colonoscopy for preventive screenings, as well as recommend immunizations that include influenza, TDAP (will administer today) - CMP14+EGFR - CBC - Hemoglobin A1c - Lipid panel  2. Encounter for hepatitis C screening test for low risk patient Will check Hepatitis C screening due to recent recommendations to screen all adults 18 years and older - Hepatitis C antibody  3. Encounter for HIV (human immunodeficiency virus) test - HIV antibody (with reflex)  4. Immunization due Will give tetanus vaccine today while in office. TDAP will be administered to adults 50-2 years old every 10 years. - Tdap vaccine greater than or equal to 7yo IM  5. Class 1 obesity due to excess calories with body mass index (BMI) of 33.0 to 33.9 in adult, unspecified whether serious comorbidity present Chronic Discussed healthy diet and regular exercise options  Encouraged to exercise at least 150 minutes per week with 2 days of  strength training  6. Vitamin D deficiency Will check vitamin D level and supplement as needed.    Also encouraged to spend 15 minutes in the sun daily.  - VITAMIN D 25 Hydroxy (Vit-D Deficiency, Fractures)  7. Tobacco abuse Smoking cessation instruction/counseling given:  counseled patient on the dangers of tobacco use, advised patient to stop smoking, and reviewed strategies to maximize success She would like to try nicotine patches  - Nicotine 21-14-7 MG/24HR KIT; Place 1 patch onto the skin daily.  Dispense: 1 kit; Refill: 0  8. 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 Will order CT scan of the neck    Patient was given opportunity to ask questions. Patient verbalized understanding of the plan and was able to repeat key elements of the plan. All questions were answered to their satisfaction.   Minette Brine, FNP   I, Minette Brine, FNP, have reviewed all documentation for this visit. The documentation on 04/01/21 for the exam, diagnosis, procedures, and orders are all accurate and complete.  THE PATIENT IS ENCOURAGED TO PRACTICE SOCIAL DISTANCING DUE TO THE COVID-19 PANDEMIC.

## 2021-03-17 NOTE — Patient Instructions (Signed)
Health Maintenance, Female Adopting a healthy lifestyle and getting preventive care are important in promoting health and wellness. Ask your health care provider about: The right schedule for you to have regular tests and exams. Things you can do on your own to prevent diseases and keep yourself healthy. What should I know about diet, weight, and exercise? Eat a healthy diet  Eat a diet that includes plenty of vegetables, fruits, low-fat dairy products, and lean protein. Do not eat a lot of foods that are high in solid fats, added sugars, or sodium.  Maintain a healthy weight Body mass index (BMI) is used to identify weight problems. It estimates body fat based on height and weight. Your health care provider can help determineyour BMI and help you achieve or maintain a healthy weight. Get regular exercise Get regular exercise. This is one of the most important things you can do for your health. Most adults should: Exercise for at least 150 minutes each week. The exercise should increase your heart rate and make you sweat (moderate-intensity exercise). Do strengthening exercises at least twice a week. This is in addition to the moderate-intensity exercise. Spend less time sitting. Even light physical activity can be beneficial. Watch cholesterol and blood lipids Have your blood tested for lipids and cholesterol at 29 years of age, then havethis test every 5 years. Have your cholesterol levels checked more often if: Your lipid or cholesterol levels are high. You are older than 29 years of age. You are at high risk for heart disease. What should I know about cancer screening? Depending on your health history and family history, you may need to have cancer screening at various ages. This may include screening for: Breast cancer. Cervical cancer. Colorectal cancer. Skin cancer. Lung cancer. What should I know about heart disease, diabetes, and high blood pressure? Blood pressure and heart  disease High blood pressure causes heart disease and increases the risk of stroke. This is more likely to develop in people who have high blood pressure readings, are of African descent, or are overweight. Have your blood pressure checked: Every 3-5 years if you are 18-39 years of age. Every year if you are 40 years old or older. Diabetes Have regular diabetes screenings. This checks your fasting blood sugar level. Have the screening done: Once every three years after age 40 if you are at a normal weight and have a low risk for diabetes. More often and at a younger age if you are overweight or have a high risk for diabetes. What should I know about preventing infection? Hepatitis B If you have a higher risk for hepatitis B, you should be screened for this virus. Talk with your health care provider to find out if you are at risk forhepatitis B infection. Hepatitis C Testing is recommended for: Everyone born from 1945 through 1965. Anyone with known risk factors for hepatitis C. Sexually transmitted infections (STIs) Get screened for STIs, including gonorrhea and chlamydia, if: You are sexually active and are younger than 29 years of age. You are older than 29 years of age and your health care provider tells you that you are at risk for this type of infection. Your sexual activity has changed since you were last screened, and you are at increased risk for chlamydia or gonorrhea. Ask your health care provider if you are at risk. Ask your health care provider about whether you are at high risk for HIV. Your health care provider may recommend a prescription medicine to help   prevent HIV infection. If you choose to take medicine to prevent HIV, you should first get tested for HIV. You should then be tested every 3 months for as long as you are taking the medicine. Pregnancy If you are about to stop having your period (premenopausal) and you may become pregnant, seek counseling before you get  pregnant. Take 400 to 800 micrograms (mcg) of folic acid every day if you become pregnant. Ask for birth control (contraception) if you want to prevent pregnancy. Osteoporosis and menopause Osteoporosis is a disease in which the bones lose minerals and strength with aging. This can result in bone fractures. If you are 65 years old or older, or if you are at risk for osteoporosis and fractures, ask your health care provider if you should: Be screened for bone loss. Take a calcium or vitamin D supplement to lower your risk of fractures. Be given hormone replacement therapy (HRT) to treat symptoms of menopause. Follow these instructions at home: Lifestyle Do not use any products that contain nicotine or tobacco, such as cigarettes, e-cigarettes, and chewing tobacco. If you need help quitting, ask your health care provider. Do not use street drugs. Do not share needles. Ask your health care provider for help if you need support or information about quitting drugs. Alcohol use Do not drink alcohol if: Your health care provider tells you not to drink. You are pregnant, may be pregnant, or are planning to become pregnant. If you drink alcohol: Limit how much you use to 0-1 drink a day. Limit intake if you are breastfeeding. Be aware of how much alcohol is in your drink. In the [place] one drink equals one 12 oz bottle of beer (355 mL), one 5 oz glass of wine (148 mL), or one 1 oz glass of hard liquor (44 mL). General instructions Schedule regular health, dental, and eye exams. Stay current with your vaccines. Tell your health care provider if: You often feel depressed. You have ever been abused or do not feel safe at home. Summary Adopting a healthy lifestyle and getting preventive care are important in promoting health and wellness. Follow your health care provider's instructions about healthy diet, exercising, and getting tested or screened for diseases. Follow your health care provider's  instructions on monitoring your cholesterol and blood pressure. This information is not intended to replace advice given to you by your health care provider. Make sure you discuss any questions you have with your healthcare provider. Document Revised: 09/07/2018 Document Reviewed: 09/07/2018 Elsevier Patient Education  2022 Elsevier Inc.  

## 2021-03-18 ENCOUNTER — Other Ambulatory Visit: Payer: Self-pay | Admitting: Nurse Practitioner

## 2021-03-18 LAB — LIPID PANEL
Chol/HDL Ratio: 2.6 ratio (ref 0.0–4.4)
Cholesterol, Total: 179 mg/dL (ref 100–199)
HDL: 70 mg/dL (ref >39)
LDL Chol Calc (NIH): 98 mg/dL (ref 0–99)
Triglycerides: 55 mg/dL (ref 0–149)
VLDL Cholesterol Cal: 11 mg/dL (ref 5–40)

## 2021-03-18 LAB — CMP14+EGFR
ALT: 13 IU/L (ref 0–32)
AST: 22 IU/L (ref 0–40)
Albumin/Globulin Ratio: 1.9 (ref 1.2–2.2)
Albumin: 4.8 g/dL (ref 3.9–5.0)
Alkaline Phosphatase: 50 IU/L (ref 44–121)
BUN/Creatinine Ratio: 12 (ref 9–23)
BUN: 10 mg/dL (ref 6–20)
Bilirubin Total: 0.4 mg/dL (ref 0.0–1.2)
CO2: 22 mmol/L (ref 20–29)
Calcium: 10 mg/dL (ref 8.7–10.2)
Chloride: 104 mmol/L (ref 96–106)
Creatinine, Ser: 0.83 mg/dL (ref 0.57–1.00)
Globulin, Total: 2.5 g/dL (ref 1.5–4.5)
Glucose: 89 mg/dL (ref 65–99)
Potassium: 4.6 mmol/L (ref 3.5–5.2)
Sodium: 142 mmol/L (ref 134–144)
Total Protein: 7.3 g/dL (ref 6.0–8.5)
eGFR: 98 mL/min/{1.73_m2} (ref >59)

## 2021-03-18 LAB — CBC
Hematocrit: 41.8 % (ref 34.0–46.6)
Hemoglobin: 14 g/dL (ref 11.1–15.9)
MCH: 30.8 pg (ref 26.6–33.0)
MCHC: 33.5 g/dL (ref 31.5–35.7)
MCV: 92 fL (ref 79–97)
Platelets: 389 10*3/uL (ref 150–450)
RBC: 4.55 x10E6/uL (ref 3.77–5.28)
RDW: 12.4 % (ref 11.7–15.4)
WBC: 6.5 10*3/uL (ref 3.4–10.8)

## 2021-03-18 LAB — HEMOGLOBIN A1C
Est. average glucose Bld gHb Est-mCnc: 100 mg/dL
Hgb A1c MFr Bld: 5.1 % (ref 4.8–5.6)

## 2021-03-18 LAB — HIV ANTIBODY (ROUTINE TESTING W REFLEX): HIV Screen 4th Generation wRfx: NONREACTIVE

## 2021-03-18 LAB — HEPATITIS C ANTIBODY: Hep C Virus Ab: <0.1 s/co ratio (ref 0.0–0.9)

## 2021-03-18 LAB — VITAMIN D 25 HYDROXY (VIT D DEFICIENCY, FRACTURES): Vit D, 25-Hydroxy: 23.4 ng/mL — ABNORMAL LOW (ref 30.0–100.0)

## 2021-04-01 ENCOUNTER — Other Ambulatory Visit: Payer: Self-pay

## 2021-04-01 DIAGNOSIS — E559 Vitamin D deficiency, unspecified: Secondary | ICD-10-CM

## 2021-04-01 MED ORDER — VITAMIN D (ERGOCALCIFEROL) 1.25 MG (50000 UNIT) PO CAPS: 50000.0000 [IU] | ORAL_CAPSULE | ORAL | 0 refills | Status: DC

## 2021-04-22 ENCOUNTER — Other Ambulatory Visit: Payer: BLUE CROSS/BLUE SHIELD

## 2021-07-16 ENCOUNTER — Ambulatory Visit
Admission: EM | Admit: 2021-07-16 | Discharge: 2021-07-16 | Disposition: A | Discharge status: Home / Self Care | Payer: 59 | Attending: Emergency Medicine | Admitting: Emergency Medicine

## 2021-07-16 ENCOUNTER — Other Ambulatory Visit: Payer: Self-pay

## 2021-07-16 DIAGNOSIS — R300 Dysuria: Secondary | ICD-10-CM | POA: Diagnosis present

## 2021-07-16 DIAGNOSIS — M62830 Muscle spasm of back: Secondary | ICD-10-CM | POA: Insufficient documentation

## 2021-07-16 DIAGNOSIS — M545 Low back pain, unspecified: Secondary | ICD-10-CM | POA: Insufficient documentation

## 2021-07-16 LAB — POCT URINALYSIS DIP (MANUAL ENTRY)
Bilirubin, UA: NEGATIVE
Blood, UA: NEGATIVE
Glucose, UA: NEGATIVE mg/dL
Ketones, POC UA: NEGATIVE mg/dL
Leukocytes, UA: NEGATIVE
Nitrite, UA: NEGATIVE
Protein Ur, POC: NEGATIVE mg/dL
Spec Grav, UA: 1.02 (ref 1.010–1.025)
Urobilinogen, UA: 0.2 E.U./dL
pH, UA: 7 (ref 5.0–8.0)

## 2021-07-16 MED ORDER — BACLOFEN 10 MG PO TABS: 10.0000 mg | ORAL_TABLET | Freq: Three times a day (TID) | ORAL | 0 refills | Status: DC

## 2021-07-16 MED ORDER — IBUPROFEN 800 MG PO TABS: 800.0000 mg | ORAL_TABLET | Freq: Three times a day (TID) | ORAL | 0 refills | Status: DC

## 2021-07-16 NOTE — ED Triage Notes (Signed)
Pt reports having lower back pain since Monday and believes she has a UTI.

## 2021-07-16 NOTE — ED Provider Notes (Signed)
[facility]    CSN: [id number] Arrival date & time: 07/16/21  1428      History   Chief Complaint Chief Complaint  Patient presents with   Back Pain    HPI Jodi Shah is a 30 y.o. female.   Patient states she had been moving a lot of heavy boxes over the weekend at work and at home and noticed 2 days ago that she began having lower back pain.  Patient states the pain is bilateral and is located in her lower back.  Patient states she had a UTI in the past and ended up having to be hospitalized for it, states it started similarly with back pain.  Patient denies fever, chills, nausea, vomiting, diarrhea, burning with urination, increased frequency of urination, increased urge to urinate, incomplete emptying, pelvic pressure, vaginal discharge, dyspareunia.  The history is provided by the patient.   Past Medical History:  Diagnosis Date   Gallstones    Vitamin D deficiency     Patient Active Problem List   Diagnosis Date Noted   Gallstones 12/10/2014   Acute calculous cholecystitis 12/10/2014    Past Surgical History:  Procedure Laterality Date   CHOLECYSTECTOMY N/A 12/10/2014   Procedure: LAPAROSCOPIC CHOLECYSTECTOMY ;  Surgeon: Coralie Keens, MD;  Location: [facility];  Service: General;  Laterality: N/A;   TUMOR REMOVAL      OB History   No obstetric history on file.      Home Medications    Prior to Admission medications   Medication Sig Start Date End Date Taking? Authorizing Provider  baclofen (LIORESAL) 10 MG tablet Take 1 tablet (10 mg total) by mouth 3 (three) times daily. 07/16/21  Yes Lynden Oxford Scales, PA-C  ibuprofen (ADVIL) 800 MG tablet Take 1 tablet (800 mg total) by mouth 3 (three) times daily. 07/16/21  Yes Lynden Oxford Scales, PA-C  levonorgestrel (MIRENA, 52 MG,) 20 MCG/DAY IUD Mirena 20 mcg/24 hours (8 yrs) 52 mg intrauterine device  Take 1 device by intrauterine route. 09/11/20  Yes [provider]  Vitamin D,  Ergocalciferol, (DRISDOL) 1.25 MG (50000 UNIT) CAPS capsule Take 1 capsule (50,000 Units total) by mouth every 7 (seven) days. 04/01/21   Minette Brine, FNP    Family History Family History  Problem Relation Age of Onset   Healthy Mother    Healthy Father    Cirrhosis Maternal Grandmother    Healthy Maternal Grandfather     Social History Social History   Tobacco Use   Smoking status: Every Day    Packs/day: 0.50    Types: Cigarettes   Smokeless tobacco: Current  Substance Use Topics   Alcohol use: Yes    Alcohol/week: 4.0 standard drinks    Types: 4 Glasses of wine per week    Comment: socially only    Drug use: Yes    Types: Marijuana     Allergies   Pumpkin seed   Review of Systems Review of Systems Pertinent findings noted in history of present illness.    Physical Exam Triage Vital Signs ED Triage Vitals [07/16/21 1448]  Enc Vitals Group     BP      Pulse      Resp      Temp      Temp src      SpO2      Weight 205 lb (93 kg)     Height      Head Circumference      Peak  Flow      Pain Score      Pain Loc      Pain Edu?      Excl. in [place]?    No data found.  Updated Vital Signs BP 107/75 (BP Location: Right Arm)   Pulse 80   Temp 98.4 F (36.9 C) (Oral)   Resp 20   Wt 205 lb (93 kg)   SpO2 98%   BMI 34.54 kg/m   Visual Acuity Right Eye Distance:   Left Eye Distance:   Bilateral Distance:    Right Eye Near:   Left Eye Near:    Bilateral Near:     Physical Exam Vitals and nursing note reviewed.  Constitutional:      Appearance: Normal appearance.  HENT:     Head: Normocephalic and atraumatic.  Eyes:     Conjunctiva/sclera: Conjunctivae normal.  Cardiovascular:     Rate and Rhythm: Normal rate and regular rhythm.     Pulses: Normal pulses.     Heart sounds: Normal heart sounds.  Pulmonary:     Effort: Pulmonary effort is normal.     Breath sounds: Normal breath sounds.  Abdominal:     General: Abdomen is flat. Bowel sounds  are normal.     Palpations: Abdomen is soft.  Musculoskeletal:        General: Tenderness (Paraspinous muscles at L1-L3 bilaterally) present. Normal range of motion.     Cervical back: Normal range of motion and neck supple.  Skin:    General: Skin is warm and dry.  Neurological:     General: No focal deficit present.     Mental Status: She is alert and oriented to person, place, and time. Mental status is at baseline.  Psychiatric:        Mood and Affect: Mood normal.        Behavior: Behavior normal.     UC Treatments / Results  Labs (all labs ordered are listed, but only abnormal results are displayed) Labs Reviewed  POCT URINALYSIS DIP (MANUAL ENTRY) - Abnormal; Notable for the following components:      Result Value   Color, UA light yellow (*)    All other components within normal limits  URINE CULTURE    EKG   Radiology No results found.  Procedures Procedures (including critical care time)  Medications Ordered in UC Medications - No data to display  Initial Impression / Assessment and Plan / UC Course  I have reviewed the triage vital signs and the nursing notes.  Pertinent labs & imaging results that were available during my care of the patient were reviewed by me and considered in my medical decision making (see chart for details).     Urinalysis today is unremarkable, we will send urine for culture to be complete.  I suspect that patient's back pain is largely secondary to moving heavy, bulky items.  I discussed this with patient and offered anti-inflammatory pain medication along with muscle relaxers.  Patient agreed to this plan of care.  I also advised patient that if her urine culture has any positive findings, we will treat her with appropriately with antibiotics.  Patient verbalized understanding and agreement of plan as discussed.  All questions were addressed during visit.  Please see discharge instructions below for further details of plan.  Final  Clinical Impressions(s) / UC Diagnoses   Final diagnoses:  Dysuria  Acute bilateral low back pain without sciatica  Lumbar paraspinal muscle spasm  Discharge Instructions      As we discussed, while your urinalysis was not revealing of any findings for infection we will be sending your urine to the lab for culture which usually takes around 3 days give or take.  If the culture is positive, we will definitely treat with antibiotics as soon as we receive the results.  In the meantime, please consider treating your lower back pain with a muscle relaxer and a nonsteroidal anti-inflammatory.  I have prescribed both of these for you.  Thank you for your visit to urgent care today, I hope you feel better soon.     ED Prescriptions     Medication Sig Dispense Auth. Provider   baclofen (LIORESAL) 10 MG tablet Take 1 tablet (10 mg total) by mouth 3 (three) times daily. 30 each Lynden Oxford Scales, PA-C   ibuprofen (ADVIL) 800 MG tablet Take 1 tablet (800 mg total) by mouth 3 (three) times daily. 21 tablet Lynden Oxford Scales, PA-C      PDMP not reviewed this encounter.   Lynden Oxford Scales, PA-C 07/16/21 1514

## 2021-07-16 NOTE — Discharge Instructions (Addendum)
As we discussed, while your urinalysis was not revealing of any findings for infection we will be sending your urine to the lab for culture which usually takes around 3 days give or take.  If the culture is positive, we will definitely treat with antibiotics as soon as we receive the results.  In the meantime, please consider treating your lower back pain with a muscle relaxer and a nonsteroidal anti-inflammatory.  I have prescribed both of these for you.  Thank you for your visit to urgent care today, I hope you feel better soon.

## 2021-07-18 LAB — URINE CULTURE: Culture: >=100000 — AB

## 2021-07-23 ENCOUNTER — Telehealth: Payer: Self-pay

## 2021-07-23 MED ORDER — NITROFURANTOIN MONOHYD MACRO 100 MG PO CAPS
100.0000 mg | ORAL_CAPSULE | Freq: Two times a day (BID) | ORAL | 0 refills | Status: DC
Start: 2021-07-23 — End: 2024-04-01

## 2021-08-19 ENCOUNTER — Other Ambulatory Visit: Payer: Self-pay | Admitting: Nurse Practitioner

## 2021-08-19 DIAGNOSIS — E559 Vitamin D deficiency, unspecified: Secondary | ICD-10-CM

## 2021-08-19 MED ORDER — VITAMIN D (ERGOCALCIFEROL) 1.25 MG (50000 UNIT) PO CAPS: 50000.0000 [IU] | ORAL_CAPSULE | ORAL | 0 refills | Status: DC

## 2022-03-18 ENCOUNTER — Encounter: Payer: 59 | Admitting: Nurse Practitioner

## 2022-11-05 ENCOUNTER — Other Ambulatory Visit (HOSPITAL_COMMUNITY): Payer: Self-pay

## 2022-11-05 MED ORDER — LISDEXAMFETAMINE DIMESYLATE 20 MG PO CAPS
20.0000 mg | ORAL_CAPSULE | Freq: Every day | ORAL | 0 refills | Status: DC
  Filled 2022-11-05 – 2022-11-06 (×3): qty 30, 30d supply, fill #0

## 2022-11-06 ENCOUNTER — Other Ambulatory Visit (HOSPITAL_BASED_OUTPATIENT_CLINIC_OR_DEPARTMENT_OTHER): Payer: Self-pay

## 2022-11-06 ENCOUNTER — Other Ambulatory Visit (HOSPITAL_COMMUNITY): Payer: Self-pay

## 2022-11-06 MED ORDER — LISDEXAMFETAMINE DIMESYLATE 20 MG PO CAPS
ORAL_CAPSULE | ORAL | 0 refills | Status: DC
  Filled 2022-11-06: qty 30, 30d supply, fill #0

## 2022-11-24 ENCOUNTER — Other Ambulatory Visit (HOSPITAL_BASED_OUTPATIENT_CLINIC_OR_DEPARTMENT_OTHER): Payer: Self-pay

## 2022-11-24 MED ORDER — METHYLPHENIDATE HCL ER (LA) 30 MG PO CP24
30.0000 mg | ORAL_CAPSULE | Freq: Every day | ORAL | 0 refills | Status: DC
  Filled 2022-11-24: qty 30, 30d supply, fill #0

## 2022-11-25 ENCOUNTER — Other Ambulatory Visit (HOSPITAL_BASED_OUTPATIENT_CLINIC_OR_DEPARTMENT_OTHER): Payer: Self-pay

## 2022-11-27 ENCOUNTER — Other Ambulatory Visit (HOSPITAL_BASED_OUTPATIENT_CLINIC_OR_DEPARTMENT_OTHER): Payer: Self-pay

## 2022-12-23 ENCOUNTER — Other Ambulatory Visit (HOSPITAL_COMMUNITY): Payer: Self-pay

## 2022-12-23 MED ORDER — METHYLPHENIDATE HCL ER (LA) 30 MG PO CP24
30.0000 mg | ORAL_CAPSULE | Freq: Every day | ORAL | 0 refills | Status: DC
  Filled 2022-12-23 – 2022-12-28 (×2): qty 30, 30d supply, fill #0

## 2022-12-24 ENCOUNTER — Other Ambulatory Visit (HOSPITAL_COMMUNITY): Payer: Self-pay

## 2022-12-25 ENCOUNTER — Other Ambulatory Visit (HOSPITAL_COMMUNITY): Payer: Self-pay

## 2022-12-28 ENCOUNTER — Other Ambulatory Visit (HOSPITAL_COMMUNITY): Payer: Self-pay

## 2022-12-29 ENCOUNTER — Other Ambulatory Visit (HOSPITAL_COMMUNITY): Payer: Self-pay

## 2022-12-30 ENCOUNTER — Other Ambulatory Visit (HOSPITAL_COMMUNITY): Payer: Self-pay

## 2022-12-30 ENCOUNTER — Other Ambulatory Visit (HOSPITAL_BASED_OUTPATIENT_CLINIC_OR_DEPARTMENT_OTHER): Payer: Self-pay

## 2022-12-30 MED ORDER — METHYLPHENIDATE HCL ER (LA) 30 MG PO CP24
30.0000 mg | ORAL_CAPSULE | Freq: Every day | ORAL | 0 refills | Status: DC
  Filled 2022-12-30: qty 30, 30d supply, fill #0

## 2023-01-25 ENCOUNTER — Other Ambulatory Visit (HOSPITAL_BASED_OUTPATIENT_CLINIC_OR_DEPARTMENT_OTHER): Payer: Self-pay

## 2023-01-26 ENCOUNTER — Other Ambulatory Visit (HOSPITAL_BASED_OUTPATIENT_CLINIC_OR_DEPARTMENT_OTHER): Payer: Self-pay

## 2023-01-26 MED ORDER — METHYLPHENIDATE HCL ER (LA) 30 MG PO CP24
30.0000 mg | ORAL_CAPSULE | Freq: Every day | ORAL | 0 refills | Status: DC
  Filled 2023-01-27: qty 30, 30d supply, fill #0

## 2023-01-27 ENCOUNTER — Other Ambulatory Visit: Payer: Self-pay

## 2023-01-27 ENCOUNTER — Other Ambulatory Visit (HOSPITAL_BASED_OUTPATIENT_CLINIC_OR_DEPARTMENT_OTHER): Payer: Self-pay

## 2023-02-26 ENCOUNTER — Other Ambulatory Visit (HOSPITAL_BASED_OUTPATIENT_CLINIC_OR_DEPARTMENT_OTHER): Payer: Self-pay

## 2023-02-26 MED ORDER — METHYLPHENIDATE HCL ER (LA) 30 MG PO CP24
30.0000 mg | ORAL_CAPSULE | Freq: Every day | ORAL | 0 refills | Status: DC
  Filled 2023-02-26: qty 30, 30d supply, fill #0

## 2023-05-07 ENCOUNTER — Other Ambulatory Visit (HOSPITAL_BASED_OUTPATIENT_CLINIC_OR_DEPARTMENT_OTHER): Payer: Self-pay

## 2023-05-07 MED ORDER — METHYLPHENIDATE HCL ER (LA) 30 MG PO CP24
30.0000 mg | ORAL_CAPSULE | Freq: Every day | ORAL | 0 refills | Status: DC
  Filled 2023-05-07 (×2): qty 30, 30d supply, fill #0

## 2023-06-07 ENCOUNTER — Encounter (HOSPITAL_BASED_OUTPATIENT_CLINIC_OR_DEPARTMENT_OTHER): Payer: Self-pay

## 2023-06-07 ENCOUNTER — Other Ambulatory Visit (HOSPITAL_BASED_OUTPATIENT_CLINIC_OR_DEPARTMENT_OTHER): Payer: Self-pay

## 2023-06-07 MED ORDER — AMPHETAMINE-DEXTROAMPHET ER 20 MG PO CP24
20.0000 mg | ORAL_CAPSULE | Freq: Every day | ORAL | 0 refills | Status: DC
  Filled 2023-06-07: qty 30, 30d supply, fill #0

## 2023-06-11 ENCOUNTER — Other Ambulatory Visit (HOSPITAL_BASED_OUTPATIENT_CLINIC_OR_DEPARTMENT_OTHER): Payer: Self-pay

## 2023-06-15 ENCOUNTER — Other Ambulatory Visit (HOSPITAL_BASED_OUTPATIENT_CLINIC_OR_DEPARTMENT_OTHER): Payer: Self-pay

## 2023-06-15 ENCOUNTER — Other Ambulatory Visit: Payer: Self-pay

## 2023-07-14 ENCOUNTER — Other Ambulatory Visit (HOSPITAL_BASED_OUTPATIENT_CLINIC_OR_DEPARTMENT_OTHER): Payer: Self-pay

## 2023-07-14 MED ORDER — AMPHETAMINE-DEXTROAMPHET ER 15 MG PO CP24
15.0000 mg | ORAL_CAPSULE | Freq: Every day | ORAL | 0 refills | Status: DC
  Filled 2023-07-14: qty 30, 30d supply, fill #0

## 2023-07-17 ENCOUNTER — Other Ambulatory Visit (HOSPITAL_BASED_OUTPATIENT_CLINIC_OR_DEPARTMENT_OTHER): Payer: Self-pay

## 2023-08-19 ENCOUNTER — Other Ambulatory Visit (HOSPITAL_BASED_OUTPATIENT_CLINIC_OR_DEPARTMENT_OTHER): Payer: Self-pay

## 2023-08-20 ENCOUNTER — Other Ambulatory Visit (HOSPITAL_BASED_OUTPATIENT_CLINIC_OR_DEPARTMENT_OTHER): Payer: Self-pay

## 2023-08-20 MED ORDER — AMPHETAMINE-DEXTROAMPHET ER 15 MG PO CP24
15.0000 mg | ORAL_CAPSULE | Freq: Every day | ORAL | 0 refills | Status: DC
  Filled 2023-08-20: qty 30, 30d supply, fill #0

## 2023-09-27 ENCOUNTER — Other Ambulatory Visit: Payer: Self-pay

## 2023-10-01 ENCOUNTER — Other Ambulatory Visit (HOSPITAL_BASED_OUTPATIENT_CLINIC_OR_DEPARTMENT_OTHER): Payer: Self-pay

## 2023-10-04 ENCOUNTER — Other Ambulatory Visit (HOSPITAL_BASED_OUTPATIENT_CLINIC_OR_DEPARTMENT_OTHER): Payer: Self-pay

## 2023-10-04 MED ORDER — AMPHETAMINE-DEXTROAMPHET ER 15 MG PO CP24
ORAL_CAPSULE | Freq: Every day | ORAL | 0 refills | Status: AC
  Filled 2023-10-04: qty 30, 30d supply, fill #0

## 2023-11-12 ENCOUNTER — Other Ambulatory Visit (HOSPITAL_BASED_OUTPATIENT_CLINIC_OR_DEPARTMENT_OTHER): Payer: Self-pay

## 2023-11-12 MED ORDER — AMPHETAMINE-DEXTROAMPHET ER 15 MG PO CP24
15.0000 mg | ORAL_CAPSULE | Freq: Every day | ORAL | 0 refills | Status: DC
  Filled 2023-11-12: qty 30, 30d supply, fill #0

## 2023-11-20 ENCOUNTER — Other Ambulatory Visit (HOSPITAL_BASED_OUTPATIENT_CLINIC_OR_DEPARTMENT_OTHER): Payer: Self-pay

## 2023-12-15 ENCOUNTER — Other Ambulatory Visit (HOSPITAL_BASED_OUTPATIENT_CLINIC_OR_DEPARTMENT_OTHER): Payer: Self-pay

## 2023-12-15 MED ORDER — AMPHETAMINE-DEXTROAMPHET ER 15 MG PO CP24
15.0000 mg | ORAL_CAPSULE | Freq: Every day | ORAL | 0 refills | Status: DC
  Filled 2023-12-15: qty 30, 30d supply, fill #0

## 2024-01-18 ENCOUNTER — Other Ambulatory Visit (HOSPITAL_BASED_OUTPATIENT_CLINIC_OR_DEPARTMENT_OTHER): Payer: Self-pay

## 2024-01-18 MED ORDER — AMPHETAMINE-DEXTROAMPHET ER 15 MG PO CP24
15.0000 mg | ORAL_CAPSULE | Freq: Every morning | ORAL | 0 refills | Status: DC
  Filled 2024-01-18: qty 30, 30d supply, fill #0

## 2024-02-29 ENCOUNTER — Other Ambulatory Visit (HOSPITAL_BASED_OUTPATIENT_CLINIC_OR_DEPARTMENT_OTHER): Payer: Self-pay

## 2024-03-02 ENCOUNTER — Other Ambulatory Visit (HOSPITAL_BASED_OUTPATIENT_CLINIC_OR_DEPARTMENT_OTHER): Payer: Self-pay

## 2024-03-02 ENCOUNTER — Encounter (HOSPITAL_BASED_OUTPATIENT_CLINIC_OR_DEPARTMENT_OTHER): Payer: Self-pay

## 2024-03-29 ENCOUNTER — Ambulatory Visit (HOSPITAL_COMMUNITY)
Admission: RE | Admit: 2024-03-29 | Discharge: 2024-03-29 | Disposition: A | Discharge status: Home / Self Care | Source: Ambulatory Visit | Attending: Emergency Medicine | Admitting: Emergency Medicine

## 2024-03-29 VITALS — BP 114/80 | HR 95 | Temp 99.3°F | Resp 18

## 2024-03-29 DIAGNOSIS — J069 Acute upper respiratory infection, unspecified: Secondary | ICD-10-CM | POA: Diagnosis not present

## 2024-03-29 MED ORDER — BENZONATATE 200 MG PO CAPS: 200.0000 mg | ORAL_CAPSULE | Freq: Three times a day (TID) | ORAL | 0 refills | Status: AC

## 2024-03-29 NOTE — ED Triage Notes (Signed)
 Patient presents with chills.cough and low grade fever x 3 days. Patient has not been taking any medication at this time.

## 2024-03-29 NOTE — ED Provider Notes (Addendum)
 [facility]    CSN: [id number] Arrival date & time: 03/29/24  1725      History   Chief Complaint Chief Complaint  Patient presents with   Cough    Cough and a bit of congestion, dizziness, cold sweat and sleeping off and on since Monday. - Entered by patient    HPI Jodi Shah is a 33 y.o. female.   Patient presents to clinic over concern of nasal congestion, hot and cold chills, fever, fatigue and a dry cough that has been present for the past 3 days.  Highest fever was 102 on Monday.  Low-grade temp today.  Has been taking DayQuil, last took yesterday.  Has not had any wheezing or shortness of breath.  Denies GI symptoms such as nausea, vomiting or diarrhea.  No recent sick contacts. Denies sore throat. Denies ear pain.   Did take an at home COVID test and this was negative.  The history is provided by the patient and medical records.  Cough   Past Medical History:  Diagnosis Date   Gallstones    Vitamin D  deficiency     Patient Active Problem List   Diagnosis Date Noted   Gallstones 12/10/2014   Acute calculous cholecystitis 12/10/2014    Past Surgical History:  Procedure Laterality Date   CHOLECYSTECTOMY N/A 12/10/2014   Procedure: LAPAROSCOPIC CHOLECYSTECTOMY ;  Surgeon: Vicenta Poli, MD;  Location: [facility] OR;  Service: General;  Laterality: N/A;   TUMOR REMOVAL      OB History   No obstetric history on file.      Home Medications    Prior to Admission medications   Medication Sig Start Date End Date Taking? Authorizing Provider  amphetamine -dextroamphetamine  (ADDERALL XR) 15 MG 24 hr capsule Take 1 capsule by mouth daily. 10/04/23  Yes   amphetamine -dextroamphetamine  (ADDERALL XR) 15 MG 24 hr capsule Take 1 capsule by mouth every morning. 01/17/24  Yes   baclofen  (LIORESAL ) 10 MG tablet Take 1 tablet (10 mg total) by mouth 3 (three) times daily. 07/16/21  Yes Joesph Shaver Scales, PA-C  benzonatate (TESSALON) 200 MG capsule Take 1  capsule (200 mg total) by mouth every 8 (eight) hours. 03/29/24  Yes Anayansi Rundquist  N, FNP  levonorgestrel (MIRENA, 52 MG,) 20 MCG/DAY IUD Mirena 20 mcg/24 hours (8 yrs) 52 mg intrauterine device  Take 1 device by intrauterine route. 09/11/20  Yes [provider]  amphetamine -dextroamphetamine  (ADDERALL XR) 20 MG 24 hr capsule Take 1 capsule (20 mg total) by mouth daily. 06/07/23     ibuprofen  (ADVIL ) 800 MG tablet Take 1 tablet (800 mg total) by mouth 3 (three) times daily. 07/16/21   Joesph Shaver Scales, PA-C  lisdexamfetamine (VYVANSE ) 20 MG capsule 1 capsule by mouth daily 11/06/22     methylphenidate  (RITALIN  LA) 30 MG 24 hr capsule Take 1 capsule (30 mg total) by mouth daily. 05/07/23     nitrofurantoin , macrocrystal-monohydrate, (MACROBID ) 100 MG capsule Take 1 capsule (100 mg total) by mouth 2 (two) times daily. 07/23/21   Lamptey, Aleene KIDD, MD  Vitamin D , Ergocalciferol , (DRISDOL ) 1.25 MG (50000 UNIT) CAPS capsule Take 1 capsule (50,000 Units total) by mouth every 7 (seven) days. 08/19/21   Georgina Speaks, FNP    Family History Family History  Problem Relation Age of Onset   Healthy Mother    Healthy Father    Cirrhosis Maternal Grandmother    Healthy Maternal Grandfather     Social History Social History   Tobacco Use  Smoking status: Every Day    Current packs/day: 0.50    Types: Cigarettes   Smokeless tobacco: Current  Substance Use Topics   Alcohol use: Yes    Alcohol/week: 4.0 standard drinks of alcohol    Types: 4 Glasses of wine per week    Comment: socially only    Drug use: Yes    Types: Marijuana     Allergies   Pumpkin seed   Review of Systems Review of Systems  Per HPI  Physical Exam Triage Vital Signs ED Triage Vitals [03/29/24 1736]  Encounter Vitals Group     BP 114/80     Girls Systolic BP Percentile      Girls Diastolic BP Percentile      Boys Systolic BP Percentile      Boys Diastolic BP Percentile      Pulse Rate 95     Resp  18     Temp 99.3 F (37.4 C)     Temp Source Oral     SpO2 96 %     Weight      Height      Head Circumference      Peak Flow      Pain Score      Pain Loc      Pain Education      Exclude from Growth Chart    No data found.  Updated Vital Signs BP 114/80 (BP Location: Left Arm)   Pulse 95   Temp 99.3 F (37.4 C) (Oral)   Resp 18   SpO2 96%   Visual Acuity Right Eye Distance:   Left Eye Distance:   Bilateral Distance:    Right Eye Near:   Left Eye Near:    Bilateral Near:     Physical Exam Vitals and nursing note reviewed.  Constitutional:      Appearance: Normal appearance.  HENT:     Head: Normocephalic and atraumatic.     Right Ear: External ear normal.     Left Ear: External ear normal.     Nose: Congestion and rhinorrhea present.     Mouth/Throat:     Mouth: Mucous membranes are moist.     Pharynx: Posterior oropharyngeal erythema present.  Eyes:     Conjunctiva/sclera: Conjunctivae normal.  Cardiovascular:     Rate and Rhythm: Normal rate and regular rhythm.     Heart sounds: Normal heart sounds. No murmur heard. Pulmonary:     Effort: Pulmonary effort is normal. No respiratory distress.     Breath sounds: Normal breath sounds. No wheezing.  Musculoskeletal:     Cervical back: Normal range of motion.  Skin:    General: Skin is warm and dry.  Neurological:     General: No focal deficit present.     Mental Status: She is alert.  Psychiatric:        Mood and Affect: Mood normal.      UC Treatments / Results  Labs (all labs ordered are listed, but only abnormal results are displayed) Labs Reviewed - No data to display  EKG   Radiology No results found.  Procedures Procedures (including critical care time)  Medications Ordered in UC Medications - No data to display  Initial Impression / Assessment and Plan / UC Course  I have reviewed the triage vital signs and the nursing notes.  Pertinent labs & imaging results that were  available during my care of the patient were reviewed by me and considered in my  medical decision making (see chart for details).  Vitals in triage reviewed, patient is hemodynamically stable.  Lungs vesicular, heart with regular rate and rhythm.  Congestion, rhinorrhea and postnasal drip present on physical exam.  Symptoms consistent with viral URI, symptomatic management discussed.  Plan of care, follow-up care return precautions given, no questions at this time.    Final Clinical Impressions(s) / UC Diagnoses   Final diagnoses:  Viral URI with cough     Discharge Instructions      Your symptoms appear viral in nature.  Alternate between 800 mg of ibuprofen  and 500 mg of Tylenol  every 4-6 hours for fever, body aches or chills.  Ensure you are staying well-hydrated with at least 64 ounces of water daily.  You can take the Tessalon Perles every 8 hours as needed to help with cough.  Over-the-counter Mucinex, 1200 mg daily, can help with loosening congestion.  Viral illnesses typically last 5 to 7 days in duration.  If you develop any new symptoms or continued symptoms please follow-up with your primary care provider or return to clinic for reevaluation.     ED Prescriptions     Medication Sig Dispense Auth. Provider   benzonatate (TESSALON) 200 MG capsule Take 1 capsule (200 mg total) by mouth every 8 (eight) hours. 30 capsule Dreama Laqueta SAILOR, FNP      PDMP not reviewed this encounter.   Dreama, Jheremy Boger  N, FNP 03/29/24 1804    Dreama, Vicki Chaffin  N, FNP 03/29/24 8194

## 2024-03-29 NOTE — Discharge Instructions (Signed)
 Your symptoms appear viral in nature.  Alternate between 800 mg of ibuprofen  and 500 mg of Tylenol  every 4-6 hours for fever, body aches or chills.  Ensure you are staying well-hydrated with at least 64 ounces of water daily.  You can take the Tessalon Perles every 8 hours as needed to help with cough.  Over-the-counter Mucinex, 1200 mg daily, can help with loosening congestion.  Viral illnesses typically last 5 to 7 days in duration.  If you develop any new symptoms or continued symptoms please follow-up with your primary care provider or return to clinic for reevaluation.

## 2024-04-01 ENCOUNTER — Encounter (INDEPENDENT_AMBULATORY_CARE_PROVIDER_SITE_OTHER): Payer: Self-pay

## 2024-04-01 ENCOUNTER — Encounter: Payer: Self-pay | Admitting: Nurse Practitioner

## 2024-04-01 ENCOUNTER — Telehealth: Admitting: Nurse Practitioner

## 2024-04-01 DIAGNOSIS — J40 Bronchitis, not specified as acute or chronic: Secondary | ICD-10-CM | POA: Diagnosis not present

## 2024-04-01 MED ORDER — AZITHROMYCIN 250 MG PO TABS: ORAL_TABLET | ORAL | 0 refills | Status: AC

## 2024-04-01 MED ORDER — PROMETHAZINE-DM 6.25-15 MG/5ML PO SYRP: 5.0000 mL | ORAL_SOLUTION | Freq: Four times a day (QID) | ORAL | 0 refills | Status: AC | PRN

## 2024-04-01 MED ORDER — IPRATROPIUM BROMIDE 0.03 % NA SOLN: 2.0000 | Freq: Two times a day (BID) | NASAL | 0 refills | Status: AC

## 2024-04-01 NOTE — Progress Notes (Signed)
 E-Visit for Cough   We are sorry that you are not feeling well.  Here is how we plan to help!  Based on your presentation I believe you most likely have A cough due to bacteria.  When patients have a fever and a productive cough with a change in color or increased sputum production, we are concerned about bacterial bronchitis.  If left untreated it can progress to pneumonia.  If your symptoms do not improve with your treatment plan it is important that you contact your provider.   I have prescribed Azithromyin 250 mg: two tablets now and then one tablet daily for 4 additonal days    In addition you may use promethazine  cough syrup which has been sent to the pharmacy as well as a steroid nasal spray for post nasal drip   From your responses in the eVisit questionnaire you describe inflammation in the upper respiratory tract which is causing a significant cough.  This is commonly called Bronchitis and has four common causes:   Allergies Viral Infections Acid Reflux Bacterial Infection Allergies, viruses and acid reflux are treated by controlling symptoms or eliminating the cause. An example might be a cough caused by taking certain blood pressure medications. You stop the cough by changing the medication. Another example might be a cough caused by acid reflux. Controlling the reflux helps control the cough.  USE OF BRONCHODILATOR (RESCUE) INHALERS: There is a risk from using your bronchodilator too frequently.  The risk is that over-reliance on a medication which only relaxes the muscles surrounding the breathing tubes can reduce the effectiveness of medications prescribed to reduce swelling and congestion of the tubes themselves.  Although you feel brief relief from the bronchodilator inhaler, your asthma may actually be worsening with the tubes becoming more swollen and filled with mucus.  This can delay other crucial treatments, such as oral steroid medications. If you need to use a  bronchodilator inhaler daily, several times per day, you should discuss this with your provider.  There are probably better treatments that could be used to keep your asthma under control.     HOME CARE Only take medications as instructed by your medical team. Complete the entire course of an antibiotic. Drink plenty of fluids and get plenty of rest. Avoid close contacts especially the very young and the elderly Cover your mouth if you cough or cough into your sleeve. Always remember to wash your hands A steam or ultrasonic humidifier can help congestion.   GET HELP RIGHT AWAY IF: You develop worsening fever. You become short of breath You cough up blood. Your symptoms persist after you have completed your treatment plan MAKE SURE YOU  Understand these instructions. Will watch your condition. Will get help right away if you are not doing well or get worse.    Thank you for choosing an e-visit.  Your e-visit answers were reviewed by a board certified advanced clinical practitioner to complete your personal care plan. Depending upon the condition, your plan could have included both over the counter or prescription medications.  Please review your pharmacy choice. Make sure the pharmacy is open so you can pick up prescription now. If there is a problem, you may contact your provider through [employer] and have the prescription routed to another pharmacy.  Your safety is important to us . If you have drug allergies check your prescription carefully.   For the next 24 hours you can use MyChart to ask questions about today's visit, request a  non-urgent call back, or ask for a work or school excuse. You will get an email in the next two days asking about your experience. I hope that your e-visit has been valuable and will speed your recovery.

## 2024-04-01 NOTE — Progress Notes (Signed)
 I have spent 5 minutes in review of e-visit questionnaire, review and updating patient chart, medical decision making and response to patient.   Claiborne Rigg, NP

## 2024-04-05 ENCOUNTER — Telehealth: Admitting: Physician Assistant

## 2024-04-05 DIAGNOSIS — B379 Candidiasis, unspecified: Secondary | ICD-10-CM

## 2024-04-05 MED ORDER — FLUCONAZOLE 150 MG PO TABS: 150.0000 mg | ORAL_TABLET | ORAL | 0 refills | Status: DC | PRN

## 2024-04-05 NOTE — Progress Notes (Signed)
 E-Visit for Vaginal Symptoms  We are sorry that you are not feeling well. Here is how we plan to help! Based on what you shared with me it looks like you: May have a yeast vaginosis due to recent antibiotic use.  Vaginosis is an inflammation of the vagina that can result in discharge, itching and pain. The cause is usually a change in the normal balance of vaginal bacteria or an infection. Vaginosis can also result from reduced estrogen levels after menopause.  The most common causes of vaginosis are:   Bacterial vaginosis which results from an overgrowth of one on several organisms that are normally present in your vagina.   Yeast infections which are caused by a naturally occurring fungus called candida.   Vaginal atrophy (atrophic vaginosis) which results from the thinning of the vagina from reduced estrogen levels after menopause.   Trichomoniasis which is caused by a parasite and is commonly transmitted by sexual intercourse.  Factors that increase your risk of developing vaginosis include: Medications, such as antibiotics and steroids Uncontrolled diabetes Use of hygiene products such as bubble bath, vaginal spray or vaginal deodorant Douching Wearing damp or tight-fitting clothing Using an intrauterine device (IUD) for birth control Hormonal changes, such as those associated with pregnancy, birth control pills or menopause Sexual activity Having a sexually transmitted infection  Your treatment plan is A single Diflucan  (fluconazole ) 150mg  tablet once.  I have electronically sent this prescription into the pharmacy that you have chosen. I have sent an extra tablet to take 3 days after the first if symptoms linger.   Be sure to take all of the medication as directed. Stop taking any medication if you develop a rash, tongue swelling or shortness of breath. Mothers who are breast feeding should consider pumping and discarding their breast milk while on these antibiotics. However,  there is no consensus that infant exposure at these doses would be harmful.  Remember that medication creams can weaken latex condoms. SABRA   HOME CARE:  Good hygiene may prevent some types of vaginosis from recurring and may relieve some symptoms:  Avoid baths, hot tubs and whirlpool spas. Rinse soap from your outer genital area after a shower, and dry the area well to prevent irritation. Don't use scented or harsh soaps, such as those with deodorant or antibacterial action. Avoid irritants. These include scented tampons and pads. Wipe from front to back after using the toilet. Doing so avoids spreading fecal bacteria to your vagina.  Other things that may help prevent vaginosis include:  Don't douche. Your vagina doesn't require cleansing other than normal bathing. Repetitive douching disrupts the normal organisms that reside in the vagina and can actually increase your risk of vaginal infection. Douching won't clear up a vaginal infection. Use a latex condom. Both female and female latex condoms may help you avoid infections spread by sexual contact. Wear cotton underwear. Also wear pantyhose with a cotton crotch. If you feel comfortable without it, skip wearing underwear to bed. Yeast thrives in [employer] Your symptoms should improve in the next day or two.  GET HELP RIGHT AWAY IF:  You have pain in your lower abdomen ( pelvic area or over your ovaries) You develop nausea or vomiting You develop a fever Your discharge changes or worsens You have persistent pain with intercourse You develop shortness of breath, a rapid pulse, or you faint.  These symptoms could be signs of problems or infections that need to be evaluated by a medical provider now.  MAKE SURE YOU   Understand these instructions. Will watch your condition. Will get help right away if you are not doing well or get worse.  Thank you for choosing an e-visit.  Your e-visit answers were reviewed by a board  certified advanced clinical practitioner to complete your personal care plan. Depending upon the condition, your plan could have included both over the counter or prescription medications.  Please review your pharmacy choice. Make sure the pharmacy is open so you can pick up prescription now. If there is a problem, you may contact your provider through [employer] and have the prescription routed to another pharmacy.  Your safety is important to us . If you have drug allergies check your prescription carefully.   For the next 24 hours you can use MyChart to ask questions about today's visit, request a non-urgent call back, or ask for a work or school excuse. You will get an email in the next two days asking about your experience. I hope that your e-visit has been valuable and will speed your recovery.    I have spent 5 minutes in review of e-visit questionnaire, review and updating patient chart, medical decision making and response to patient.   Delon CHRISTELLA Dickinson, PA-C

## 2024-06-26 ENCOUNTER — Other Ambulatory Visit: Payer: Self-pay | Admitting: Medical Genetics

## 2024-07-09 ENCOUNTER — Telehealth: Admitting: Family

## 2024-07-09 DIAGNOSIS — B3731 Acute candidiasis of vulva and vagina: Secondary | ICD-10-CM | POA: Diagnosis not present

## 2024-07-09 MED ORDER — FLUCONAZOLE 150 MG PO TABS: 150.0000 mg | ORAL_TABLET | ORAL | 0 refills | Status: AC | PRN

## 2024-07-09 NOTE — Progress Notes (Signed)

## 2024-08-10 ENCOUNTER — Other Ambulatory Visit
